# Patient Record
Sex: Female | Born: 1985
Health system: Southern US, Community
[De-identification: ages and names within clinical notes are randomized; demographics above are authoritative.]

## PROBLEM LIST (undated history)

## (undated) DIAGNOSIS — D649 Anemia, unspecified: Secondary | ICD-10-CM

## (undated) HISTORY — DX: Anemia, unspecified: D64.9

## (undated) HISTORY — PX: NO PAST SURGERIES: SHX2092

---

## 2004-07-16 ENCOUNTER — Emergency Department: Payer: Self-pay | Admitting: Emergency Medicine

## 2004-08-30 ENCOUNTER — Emergency Department: Payer: Self-pay | Admitting: Unknown Physician Specialty

## 2008-10-21 ENCOUNTER — Emergency Department: Payer: Self-pay | Admitting: Emergency Medicine

## 2011-02-21 ENCOUNTER — Emergency Department: Payer: Self-pay | Admitting: Unknown Physician Specialty

## 2012-07-11 ENCOUNTER — Emergency Department: Payer: Self-pay | Admitting: Emergency Medicine

## 2012-07-11 LAB — CBC
HCT: 34.3 % — ABNORMAL LOW (ref 35.0–47.0)
HGB: 11.2 g/dL — ABNORMAL LOW (ref 12.0–16.0)
MCH: 30.6 pg (ref 26.0–34.0)
MCV: 94 fL (ref 80–100)

## 2012-07-11 LAB — COMPREHENSIVE METABOLIC PANEL
Albumin: 4 g/dL (ref 3.4–5.0)
Alkaline Phosphatase: 62 U/L (ref 50–136)
BUN: 16 mg/dL (ref 7–18)
Chloride: 104 mmol/L (ref 98–107)
Co2: 25 mmol/L (ref 21–32)
EGFR (Non-African Amer.): >60
Glucose: 83 mg/dL (ref 65–99)
Osmolality: 272 (ref 275–301)
SGOT(AST): 17 U/L (ref 15–37)
SGPT (ALT): 24 U/L (ref 12–78)

## 2012-07-11 LAB — URINALYSIS, COMPLETE
Bacteria: NONE SEEN
Glucose,UR: NEGATIVE mg/dL (ref 0–75)
Ketone: NEGATIVE
Leukocyte Esterase: NEGATIVE
Nitrite: NEGATIVE
Ph: 5 (ref 4.5–8.0)
Protein: NEGATIVE
RBC,UR: 69 /HPF (ref 0–5)
Squamous Epithelial: 1

## 2012-07-11 LAB — HCG, QUANTITATIVE, PREGNANCY: Beta Hcg, Quant.: 1660 m[IU]/mL — ABNORMAL HIGH

## 2012-07-13 ENCOUNTER — Other Ambulatory Visit: Payer: Self-pay | Admitting: Obstetrics and Gynecology

## 2012-07-13 LAB — HCG, QUANTITATIVE, PREGNANCY: Beta Hcg, Quant.: 3463 m[IU]/mL — ABNORMAL HIGH

## 2012-07-19 ENCOUNTER — Emergency Department: Payer: Self-pay | Admitting: Emergency Medicine

## 2012-07-19 LAB — URINALYSIS, COMPLETE
Bilirubin,UR: NEGATIVE
Glucose,UR: NEGATIVE mg/dL (ref 0–75)
Ketone: NEGATIVE
Nitrite: NEGATIVE
Ph: 7 (ref 4.5–8.0)
Protein: 30
RBC,UR: 125 /HPF (ref 0–5)
Specific Gravity: 1.015 (ref 1.003–1.030)
Squamous Epithelial: 4
WBC UR: 2 /HPF (ref 0–5)

## 2012-07-19 LAB — CBC
HCT: 32.7 % — ABNORMAL LOW (ref 35.0–47.0)
HGB: 10.8 g/dL — ABNORMAL LOW (ref 12.0–16.0)
MCH: 30.6 pg (ref 26.0–34.0)
RBC: 3.54 10*6/uL — ABNORMAL LOW (ref 3.80–5.20)
RDW: 12.9 % (ref 11.5–14.5)
WBC: 11.9 10*3/uL — ABNORMAL HIGH (ref 3.6–11.0)

## 2012-07-19 LAB — COMPREHENSIVE METABOLIC PANEL
Alkaline Phosphatase: 52 U/L (ref 50–136)
BUN: 14 mg/dL (ref 7–18)
Calcium, Total: 8.7 mg/dL (ref 8.5–10.1)
Chloride: 106 mmol/L (ref 98–107)
EGFR (African American): >60
EGFR (Non-African Amer.): >60
Osmolality: 277 (ref 275–301)
SGOT(AST): 14 U/L — ABNORMAL LOW (ref 15–37)
SGPT (ALT): 20 U/L (ref 12–78)
Total Protein: 6.6 g/dL (ref 6.4–8.2)

## 2012-07-19 LAB — HCG, QUANTITATIVE, PREGNANCY: Beta Hcg, Quant.: 15663 m[IU]/mL — ABNORMAL HIGH

## 2012-07-19 LAB — LIPASE, BLOOD: Lipase: 73 U/L (ref 73–393)

## 2012-07-21 LAB — URINE CULTURE

## 2013-03-13 ENCOUNTER — Inpatient Hospital Stay: Payer: Self-pay | Admitting: Obstetrics & Gynecology

## 2013-03-13 LAB — CBC WITH DIFFERENTIAL/PLATELET
Basophil %: 0.1 %
Eosinophil %: 0 %
HCT: 34 % — ABNORMAL LOW (ref 35.0–47.0)
HGB: 11.3 g/dL — ABNORMAL LOW (ref 12.0–16.0)
Lymphocyte %: 11.2 %
MCHC: 33.1 g/dL (ref 32.0–36.0)
MCV: 89 fL (ref 80–100)
Monocyte #: 0.8 x10 3/mm (ref 0.2–0.9)
Monocyte %: 5.4 %
RBC: 3.82 10*6/uL (ref 3.80–5.20)
RDW: 13.5 % (ref 11.5–14.5)
WBC: 14.3 10*3/uL — ABNORMAL HIGH (ref 3.6–11.0)

## 2014-01-24 IMAGING — US US OB < 14 WEEKS - US OB TV
1 series · 14 of 28 positions shown · non-contrast
Comparison: none

REASON FOR EXAM: pain, cramping
COMMENTS:

PROCEDURE:     US  - US OB LESS THAN 14 WEEKS/W TRANS  - July 19, 2012  [DATE]
RESULT:     Comparison: None
TECHNIQUE: Multiple transabdominal gray-scale images and endovaginal
gray-scale images of the pelvis performed.

[Series 1: us ob < 14 weeks - us ob tv · 0.18mm/px · 14 of 110 slices shown]
[im 5/110]
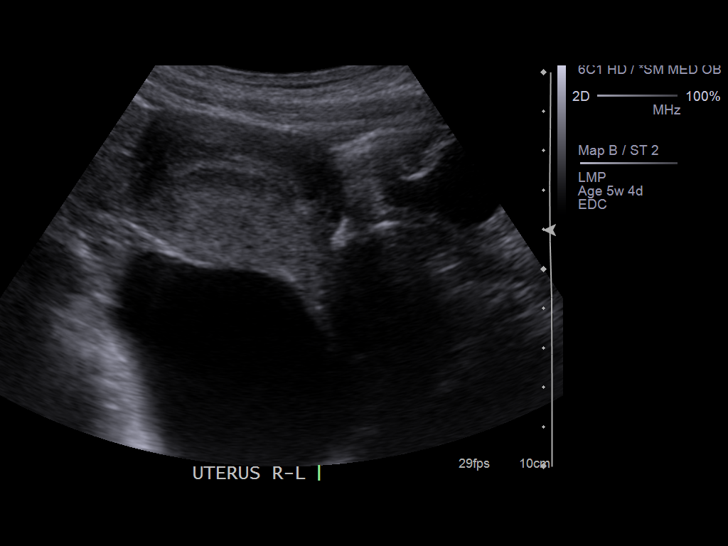
[im 13/110]
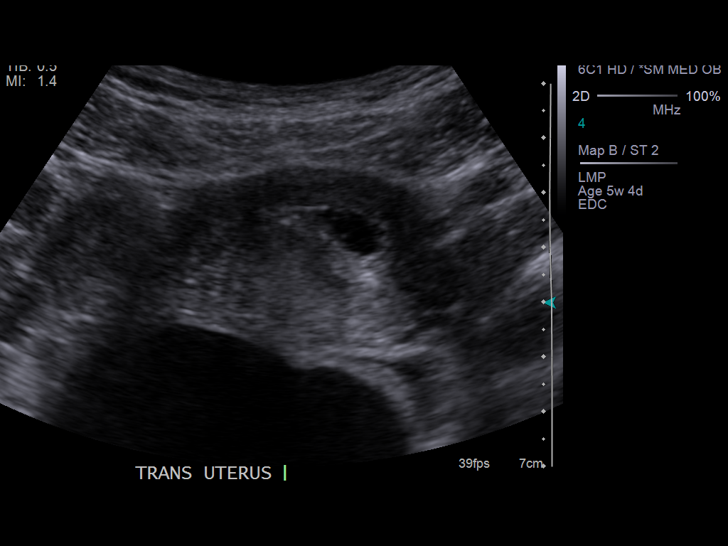
[im 21/110]
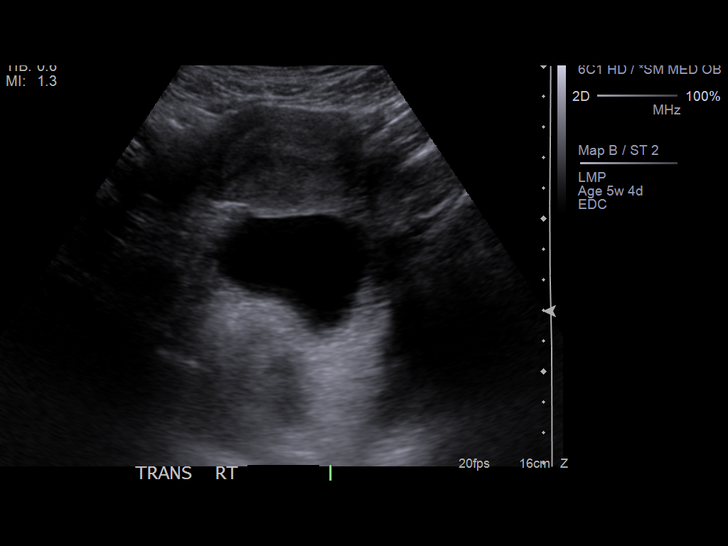
[im 29/110]
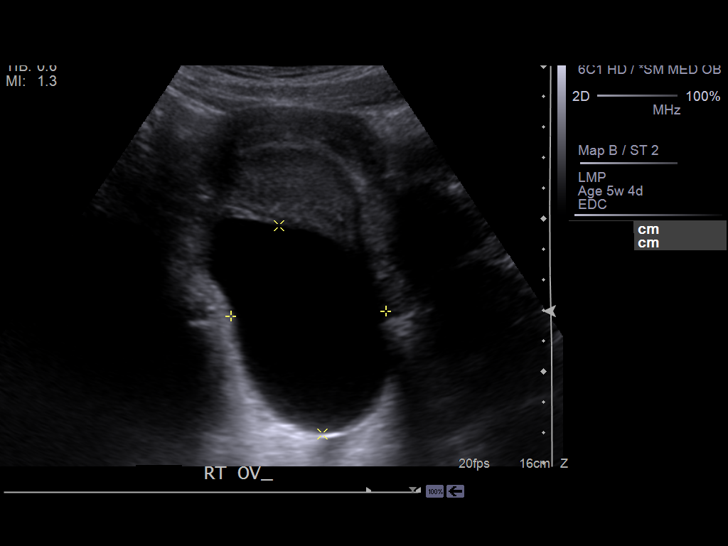
[im 37/110]
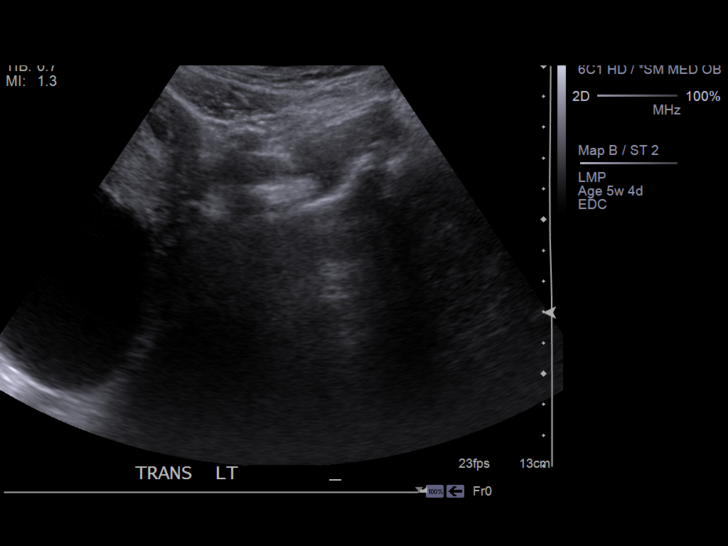
[im 45/110]
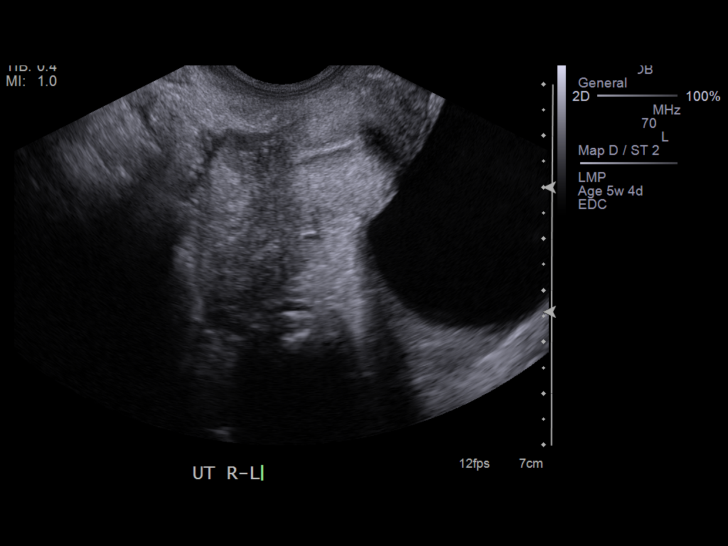
[im 53/110]
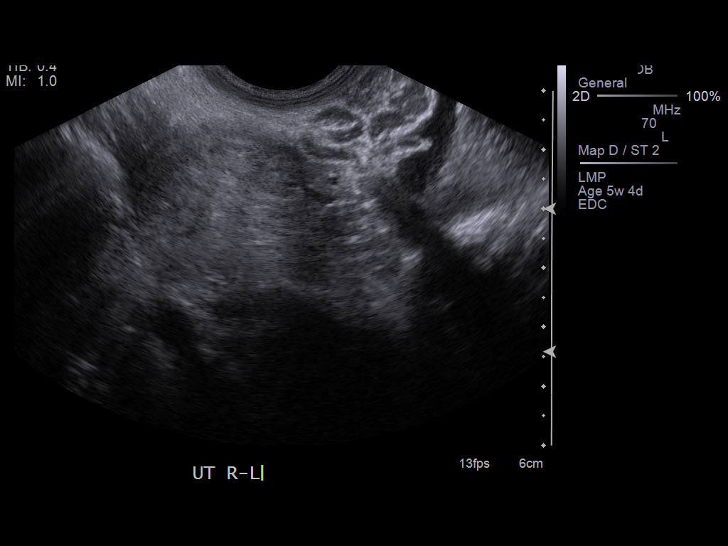
[im 61/110]
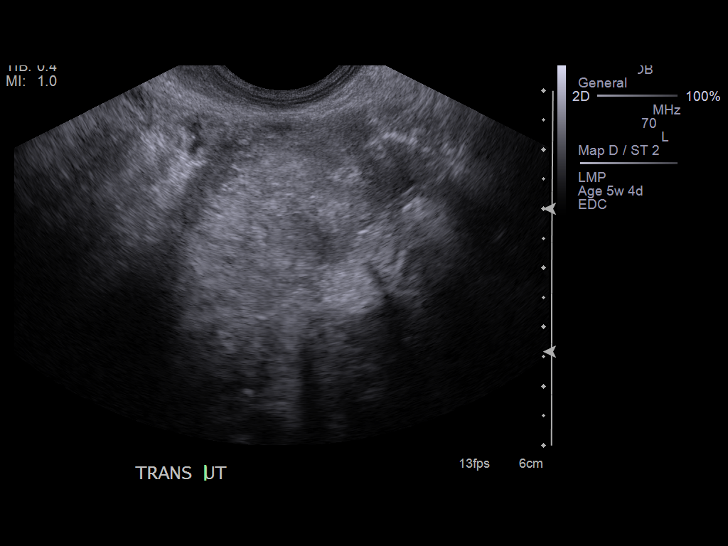
[im 69/110]
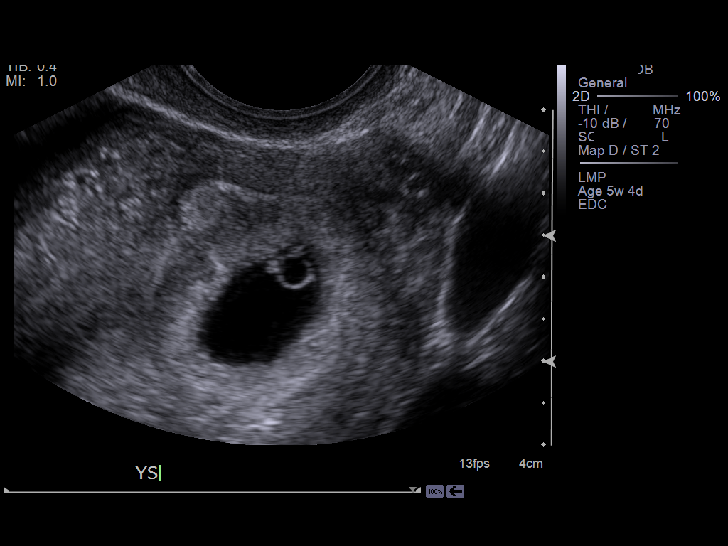
[im 77/110]
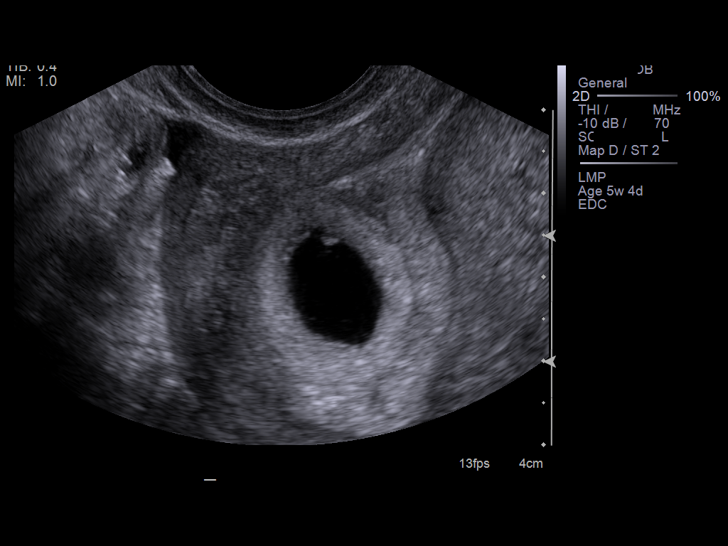
[im 85/110]
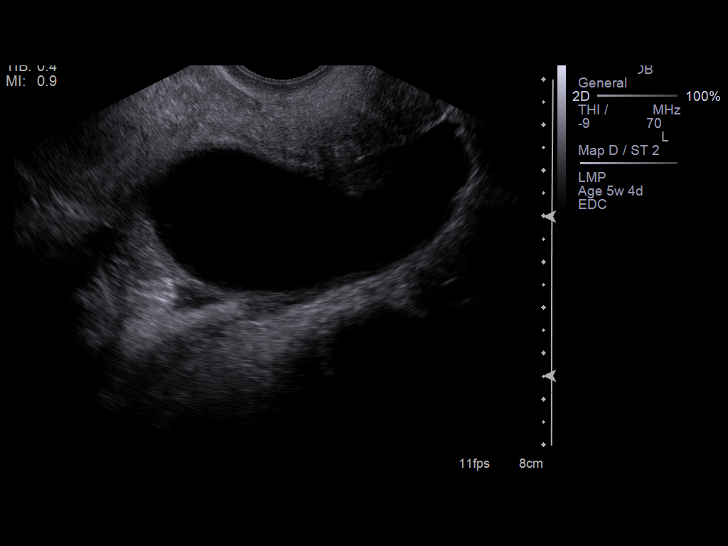
[im 93/110]
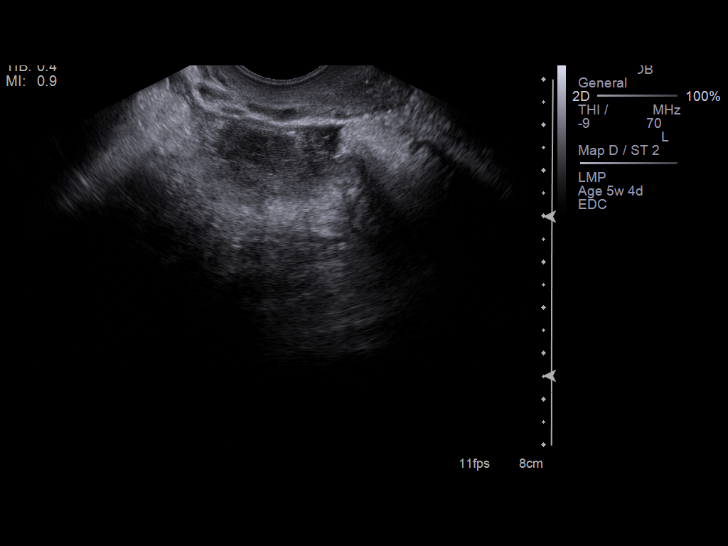
[im 101/110]
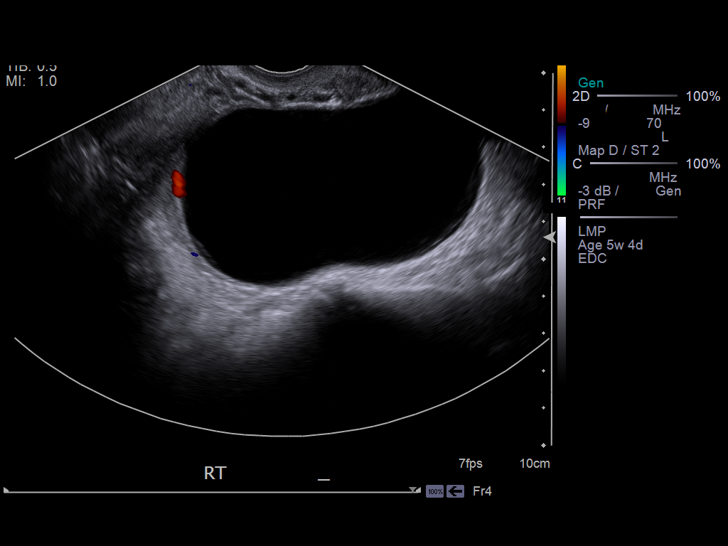
[im 110/110]
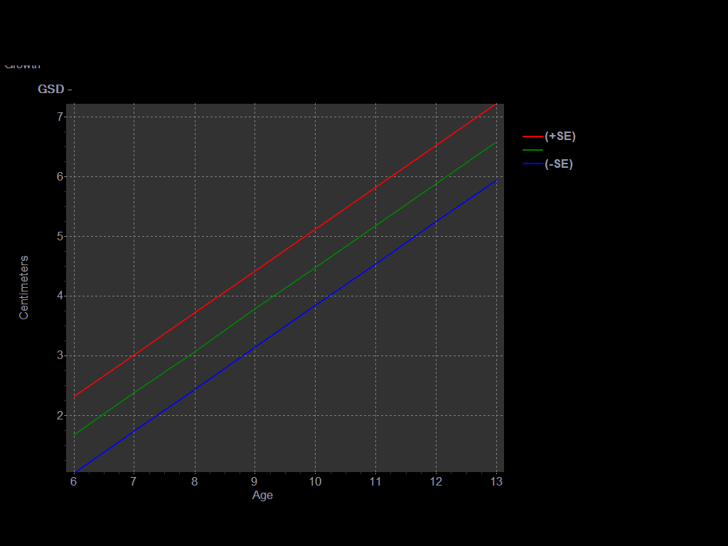

[14 of 28 positions shown; findings below may reference images not displayed]

FINDINGS: There is a single live intrauterine pregnancy visualized with a crown-rump
length of 0.5 cm dating the pregnancy at 6 weeks 3 days. There is a normal
yolk sac. There is a normal fetal heart rate of 94 beats per minute.

The right ovary measures 4.8 x 3.3 x 4.6 cm.  The left ovary is not
visualized. There is a right ovarian cyst measuring 8.2 x 4.3 x 7.2 cm.

There is no pelvic free fluid.
IMPRESSION: Single live intrauterine pregnancy dating 6 weeks 3 days with an estimated
due date of 03/11/2013.

There is a large right ovarian cystic mass measuring a 8.2 x 4.3 x 7.2 cm
likely representing a simple cyst, but given its size a cystic neoplasm is
not entirely excluded. Continued attention on followup examinations is
recommended.

[REDACTED]

## 2014-08-02 NOTE — H&P (Signed)
L&D Evaluation:  History Expanded:  HPI 29 yo G1, EDD of 03/17/13 per LMP & 12 wk US, presents with c/o regular ctx since 0300 this am. Denies LOF, VB or decreased FM. [facility] for early entry to care, elevated 1 hr with normal 3 hr, anemia. Needs TDAP.   Blood Type (Maternal) B positive   Group B Strep Results Maternal (Result >5wks must be treated as unknown) negative   Maternal HIV Negative   Maternal Syphilis Ab Nonreactive   Maternal Varicella Immune   Rubella Results (Maternal) immune   Maternal T-Dap Nonimmune   Patient's Medical History No Chronic Illness   Patient's Surgical History none   Medications Pre Natal Vitamins   Allergies NKDA   Social History d/c tobacco and marijuana in early pregnancy   ROS:  ROS see HPI   Exam:  Vital Signs stable   General no apparent distress   Mental Status clear   Chest clear   Heart normal sinus rhythm   Abdomen gravid, tender with contractions   Fetal Position vertex   Pelvic no external lesions, 4/90/-1   Mebranes Intact   FHT Category 1 tracing with occasional early or variable decelerations   Ucx regular   Impression:  Impression active labor   Plan:  Comments Admit for labor IV medication if desired Undecided at this time about epidural   Electronic Signatures: Vella KohlerBrothers, Anastasio Wogan K (CNM)  (Signed 20-Dec-14 14:14)  Authored: L&D Evaluation   Last Updated: 20-Dec-14 14:14 by Vella KohlerBrothers, Lakiah Dhingra K (CNM)

## 2015-09-12 ENCOUNTER — Encounter: Payer: Self-pay | Admitting: Emergency Medicine

## 2015-09-12 ENCOUNTER — Emergency Department
Admission: EM | Admit: 2015-09-12 | Discharge: 2015-09-12 | Disposition: A | Discharge status: Home / Self Care | Payer: Self-pay | Attending: Emergency Medicine | Admitting: Emergency Medicine

## 2015-09-12 DIAGNOSIS — M545 Low back pain, unspecified: Secondary | ICD-10-CM

## 2015-09-12 DIAGNOSIS — F17200 Nicotine dependence, unspecified, uncomplicated: Secondary | ICD-10-CM | POA: Insufficient documentation

## 2015-09-12 DIAGNOSIS — N390 Urinary tract infection, site not specified: Secondary | ICD-10-CM | POA: Insufficient documentation

## 2015-09-12 LAB — URINALYSIS COMPLETE WITH MICROSCOPIC (ARMC ONLY)
Bilirubin Urine: NEGATIVE
Glucose, UA: NEGATIVE mg/dL
KETONES UR: NEGATIVE mg/dL
Nitrite: NEGATIVE
PH: 7 (ref 5.0–8.0)
Protein, ur: 30 mg/dL — AB
Specific Gravity, Urine: 1.023 (ref 1.005–1.030)

## 2015-09-12 LAB — POCT PREGNANCY, URINE: Preg Test, Ur: NEGATIVE

## 2015-09-12 MED ORDER — HYDROCODONE-ACETAMINOPHEN 5-325 MG PO TABS: 1.0000 | ORAL_TABLET | ORAL | Status: DC | PRN

## 2015-09-12 MED ORDER — SULFAMETHOXAZOLE-TRIMETHOPRIM 800-160 MG PO TABS: 1.0000 | ORAL_TABLET | Freq: Two times a day (BID) | ORAL | Status: DC

## 2015-09-12 NOTE — Discharge Instructions (Signed)
[facility] if any continued problems. Increase fluids. Begin taking antibiotics as instructed until completely finished. Norco as needed for pain. Be aware that this may cause drowsiness and increase your risk for falling.

## 2015-09-12 NOTE — ED Provider Notes (Signed)
[facility] Emergency Department Provider Note   ____________________________________________  Time seen: Approximately 1:16 PM  I have reviewed the triage vital signs and the nursing notes.   HISTORY  Chief Complaint Back Pain    HPI Chelsea Adkins is a 30 y.o. female who arrives to the emergency department with complaint of new onset Right mid-back pain x <1 day. Pain was noticeable upon awakening this morning when the patient noticed that she could barely move. She attempted to pop her back but feels that may have made her pain worse. This morning it was also exacerbated by picking up her daughter and putting her into the car, leaving her barely able to walk at work this morning. Describes the pain as non-radiating, aching, "feeling like a knot", and rated at a 7/10. No pharmacologic or non-pharmacologic management of pain. Denies previous trauma to the area or similar pain events in the past. She believes it is possible that she slept in a weird way, but is unable to recall. She denies fevers, chills, fatigue, urinary symptoms, hx of nephrolithiasis, weakness, numbness and paraesthesias.     History reviewed. No pertinent past medical history.  There are no active problems to display for this patient.   History reviewed. No pertinent past surgical history.  Current Outpatient Rx  Name  Route  Sig  Dispense  Refill  . HYDROcodone-acetaminophen (NORCO/VICODIN) 5-325 MG tablet   Oral   Take 1 tablet by mouth every 4 (four) hours as needed for moderate pain.   20 tablet   0   . sulfamethoxazole-trimethoprim (BACTRIM DS,SEPTRA DS) 800-160 MG tablet   Oral   Take 1 tablet by mouth 2 (two) times daily.   20 tablet   0     Allergies Review of patient's allergies indicates no known allergies.  No family history on file.  Social History Social History  Substance Use Topics  . Smoking status: Current Every Day Smoker  . Smokeless tobacco: None    . Alcohol Use: Yes    Review of Systems Constitutional: No fever/chills Eyes: No visual changes. ENT: No sore throat. Cardiovascular: Denies chest pain. Respiratory: Denies shortness of breath. Gastrointestinal: No abdominal pain.  No nausea, no vomiting.  No diarrhea.  No constipation. Genitourinary: Negative for dysuria.Negative for vaginal discharge. Musculoskeletal: Positive for right sided back pain Skin: Negative for rash. Neurological: Negative for headaches, focal weakness or numbness.  10-point ROS otherwise negative.  ____________________________________________   PHYSICAL EXAM:  VITAL SIGNS: ED Triage Vitals  Enc Vitals Group     BP 09/12/15 1130 99/73 mmHg     Pulse Rate 09/12/15 1130 79     Resp 09/12/15 1130 20     Temp 09/12/15 1130 98.1 F (36.7 C)     Temp Source 09/12/15 1130 Oral     SpO2 09/12/15 1130 98 %     Weight 09/12/15 1130 200 lb (90.719 kg)     Height 09/12/15 1130  (1.702 m)     Head Cir --      Peak Flow --      Pain Score 09/12/15 1131 7     Pain Loc --      Pain Edu? --      Excl. in GC? --     Constitutional: Alert and oriented. Well appearing and in no acute distress. Eyes: Conjunctivae are normal. PERRL. EOMI. Head: Atraumatic. Nose: No congestion/rhinnorhea. Mouth/Throat: Mucous membranes are moist.  Oropharynx non-erythematous. Neck: No stridor.  Hematological/Lymphatic/Immunilogical: No cervical lymphadenopathy. Cardiovascular: Normal rate, regular rhythm. Grossly normal heart sounds.  Good peripheral circulation. Respiratory: Normal respiratory effort.  No retractions. Lungs CTAB. Gastrointestinal: Soft and nontender. No distention.  Slightly positive for right CVA tenderness. Musculoskeletal: Moves upper and lower extremities without any difficulty. Range of motion of the lumbar spine is restricted secondary to discomfort. There is no active muscle spasm seen. Patient is tender right flank area. Straight leg raises  increased back pain. Neurologic:  Normal speech and language. No gross focal neurologic deficits are appreciated. No gait instability. Skin:  Skin is warm, dry and intact. No rash noted. Psychiatric: Mood and affect are normal. Speech and behavior are normal.  ____________________________________________   LABS (all labs ordered are listed, but only abnormal results are displayed)  Labs Reviewed  URINALYSIS COMPLETEWITH MICROSCOPIC ([facility] ONLY) - Abnormal; Notable for the following:    Color, Urine YELLOW (*)    APPearance CLOUDY (*)    Hgb urine dipstick 3+ (*)    Protein, ur 30 (*)    Leukocytes, UA 1+ (*)    Bacteria, UA RARE (*)    Squamous Epithelial / LPF 6-30 (*)    All other components within normal limits  POC URINE PREG, ED  POCT PREGNANCY, URINE    PROCEDURES  Procedure(s) performed: None  Critical Care performed: No  ____________________________________________   INITIAL IMPRESSION / ASSESSMENT AND PLAN / ED COURSE  Pertinent labs & imaging results that were available during my care of the patient were reviewed by me and considered in my medical decision making (see chart for details).  Patient was started on Bactrim DS twice a day for 10 days along with Norco as needed for pain. Patient is encouraged to drink plenty of fluids and to follow-up with her primary care doctor or can of the clinic if any continued problems. ____________________________________________   FINAL CLINICAL IMPRESSION(S) / ED DIAGNOSES  Final diagnoses:  Acute lumbar back pain  Acute urinary tract infection      NEW MEDICATIONS STARTED DURING THIS VISIT:  Discharge Medication List as of 09/12/2015  2:51 PM    START taking these medications   Details  HYDROcodone-acetaminophen (NORCO/VICODIN) 5-325 MG tablet Take 1 tablet by mouth every 4 (four) hours as needed for moderate pain., Starting 09/12/2015, Until Discontinued, Print    sulfamethoxazole-trimethoprim (BACTRIM  DS,SEPTRA DS) 800-160 MG tablet Take 1 tablet by mouth 2 (two) times daily., Starting 09/12/2015, Until Discontinued, Print         Note:  This document was prepared using Dragon voice recognition software and may include unintentional dictation errors.    Tommi RumpsRhonda L Summers, PA-C 09/12/15 1532  Leona CarryLinda M Leontine Radman, MD 09/12/15 [phone]

## 2015-09-12 NOTE — ED Notes (Signed)
Pt to ed with c/o back pain that started this am when she awoke.  Denies injury.

## 2015-09-12 NOTE — ED Notes (Signed)
States she woke up with pain to mid back this am .. denies any injury prior to this am  Ambulates slowly d/t pain.  Area to mid back is tender on palpation

## 2017-04-23 ENCOUNTER — Encounter: Payer: Self-pay | Admitting: Emergency Medicine

## 2017-04-23 ENCOUNTER — Other Ambulatory Visit: Payer: Self-pay

## 2017-04-23 ENCOUNTER — Emergency Department
Admission: EM | Admit: 2017-04-23 | Discharge: 2017-04-23 | Disposition: A | Discharge status: Home / Self Care | Payer: Self-pay | Attending: Emergency Medicine | Admitting: Emergency Medicine

## 2017-04-23 DIAGNOSIS — F17200 Nicotine dependence, unspecified, uncomplicated: Secondary | ICD-10-CM | POA: Insufficient documentation

## 2017-04-23 DIAGNOSIS — J3489 Other specified disorders of nose and nasal sinuses: Secondary | ICD-10-CM | POA: Insufficient documentation

## 2017-04-23 DIAGNOSIS — B9789 Other viral agents as the cause of diseases classified elsewhere: Secondary | ICD-10-CM | POA: Insufficient documentation

## 2017-04-23 DIAGNOSIS — J069 Acute upper respiratory infection, unspecified: Secondary | ICD-10-CM | POA: Insufficient documentation

## 2017-04-23 DIAGNOSIS — R0981 Nasal congestion: Secondary | ICD-10-CM | POA: Insufficient documentation

## 2017-04-23 MED ORDER — PSEUDOEPH-BROMPHEN-DM 30-2-10 MG/5ML PO SYRP: 5.0000 mL | ORAL_SOLUTION | Freq: Four times a day (QID) | ORAL | 0 refills | Status: DC | PRN

## 2017-04-23 NOTE — Discharge Instructions (Signed)
Follow-up with your primary care doctor or [facility] if any continued problems.  To increase fluids.  Tylenol or ibuprofen as needed for body aches or fever.  Begin taking Bromfed-DM as needed for cough and congestion.

## 2017-04-23 NOTE — ED Notes (Signed)
Pt presents with cough that started on Monday Pt has taken OTC mucinex sinus relief with no relief. Pt states she is congested in head and chest. Pt denies fever n/v/d. Pt is NAD awaiting EDP.

## 2017-04-23 NOTE — ED Provider Notes (Signed)
[facility] Emergency Department Provider Note   ____________________________________________   First MD Initiated Contact with Patient 04/23/17 1226     (approximate)  I have reviewed the triage vital signs and the nursing notes.   HISTORY  Chief Complaint Cough  HPI Chelsea Adkins is a 32 y.o. female is here with complaint of cough for 3 days.  Patient states that she has had a low-grade temp at home.  Patient has taken over-the-counter cough medication without any relief.  She states she has had a low-grade temp at home.  She denies any nausea, vomiting, or diarrhea.  Patient states that she did miss work.   History reviewed. No pertinent past medical history.  There are no active problems to display for this patient.   History reviewed. No pertinent surgical history.  Prior to Admission medications   Medication Sig Start Date End Date Taking? Authorizing Provider  brompheniramine-pseudoephedrine-DM 30-2-10 MG/5ML syrup Take 5 mLs by mouth 4 (four) times daily as needed. 04/23/17   Tommi RumpsSummers, Rhonda L, PA-C    Allergies Patient has no known allergies.  No family history on file.  Social History Social History   Tobacco Use  . Smoking status: Current Every Day Smoker  Substance Use Topics  . Alcohol use: Yes  . Drug use: No    Review of Systems Constitutional: Positive low-grade fever/positive chills Eyes: No visual changes. ENT: No sore throat. Cardiovascular: Denies chest pain. Respiratory: Denies shortness of breath.  Positive nonproductive cough. Gastrointestinal: No abdominal pain.  No nausea, no vomiting.  No diarrhea.   Musculoskeletal: Negative for muscle aches. Skin: Negative for rash. Neurological: Negative for headaches, focal weakness or numbness. ___________________________________________   PHYSICAL EXAM:  VITAL SIGNS: ED Triage Vitals [04/23/17 1209]  Enc Vitals Group     BP 116/69     Pulse Rate 83     Resp 16      Temp 98.7 F (37.1 C)     Temp Source Oral     SpO2 99 %     Weight 173 lb (78.5 kg)     Height 5\' 7"  (1.702 m)     Head Circumference      Peak Flow      Pain Score      Pain Loc      Pain Edu?      Excl. in GC?    Constitutional: Alert and oriented. Well appearing and in no acute distress. Eyes: Conjunctivae are normal.  Head: Atraumatic. Nose: Mild congestion/rhinnorhea.  TMs are dull bilaterally. Mouth/Throat: Mucous membranes are moist.  Oropharynx non-erythematous.  Moderate posterior drainage seen. Neck: No stridor.   Hematological/Lymphatic/Immunilogical: No cervical lymphadenopathy. Cardiovascular: Normal rate, regular rhythm. Grossly normal heart sounds.  Good peripheral circulation. Respiratory: Normal respiratory effort.  No retractions. Lungs CTAB. Musculoskeletal: Moves upper and lower extremities and normal gait was noted. Neurologic:  Normal speech and language. No gross focal neurologic deficits are appreciated. No gait instability. Skin:  Skin is warm, dry and intact. No rash noted. Psychiatric: Mood and affect are normal. Speech and behavior are normal.  ____________________________________________   LABS (all labs ordered are listed, but only abnormal results are displayed)  Labs Reviewed - No data to display  PROCEDURES  Procedure(s) performed: None  Procedures  Critical Care performed: No  ____________________________________________   INITIAL IMPRESSION / ASSESSMENT AND PLAN / ED COURSE Patient was given prescription for Bromfed-DM as needed for cough and congestion.  She is to increase fluids.  Tylenol or ibuprofen as needed for body aches and fever.  She is to follow-up with her PCP or [facility] if any continued problems.  She is aware that this is a viral process and most likely will need to run its course.  In the meantime she will treat symptoms. ____________________________________________   FINAL CLINICAL IMPRESSION(S) / ED  DIAGNOSES  Final diagnoses:  Viral URI with cough     ED Discharge Orders        Ordered    brompheniramine-pseudoephedrine-DM 30-2-10 MG/5ML syrup  4 times daily PRN     04/23/17 1255       Note:  This document was prepared using Dragon voice recognition software and may include unintentional dictation errors.    Tommi Rumps, PA-C 04/23/17 1414    Emily Filbert, MD 04/23/17 [phone]

## 2017-04-23 NOTE — ED Triage Notes (Signed)
Pt to ED via POV c/o cough x 3 days. Pt states that she is unable to get anything up when she coughs. Pt also c/o congestion and chills. Pt denies fever. Pt in NAD

## 2017-04-23 NOTE — ED Notes (Signed)
First Nurse Note:  Patient alert and oriented, complaining of cough.

## 2018-11-25 ENCOUNTER — Ambulatory Visit: Payer: 59 | Admitting: Urology

## 2018-11-25 ENCOUNTER — Encounter: Payer: Self-pay | Admitting: Urology

## 2018-11-25 ENCOUNTER — Other Ambulatory Visit: Payer: Self-pay

## 2018-11-25 VITALS — BP 99/63 | HR 84 | Ht 67.0 in | Wt 180.0 lb

## 2018-11-25 DIAGNOSIS — R3129 Other microscopic hematuria: Secondary | ICD-10-CM | POA: Diagnosis not present

## 2018-11-25 LAB — MICROSCOPIC EXAMINATION
Bacteria, UA: NONE SEEN
RBC: >30 /hpf — AB (ref 0–2)

## 2018-11-25 LAB — URINALYSIS, COMPLETE
Bilirubin, UA: NEGATIVE
Glucose, UA: NEGATIVE
Ketones, UA: NEGATIVE
Leukocytes,UA: NEGATIVE
Nitrite, UA: NEGATIVE
Specific Gravity, UA: 1.02 (ref 1.005–1.030)
Urobilinogen, Ur: 1 mg/dL (ref 0.2–1.0)
pH, UA: 7 (ref 5.0–7.5)

## 2018-11-25 NOTE — Patient Instructions (Signed)
Cystoscopy Cystoscopy is a procedure that is used to help diagnose and sometimes treat conditions that affect the lower urinary tract. The lower urinary tract includes the bladder and the urethra. The urethra is the tube that drains urine from the bladder. Cystoscopy is done using a thin, tube-shaped instrument with a light and camera at the end (cystoscope). The cystoscope may be hard or flexible, depending on the goal of the procedure. The cystoscope is inserted through the urethra, into the bladder. Cystoscopy may be recommended if you have:  Urinary tract infections that keep coming back.  Blood in the urine (hematuria).  An inability to control when you urinate (urinary incontinence) or an overactive bladder.  Unusual cells found in a urine sample.  A blockage in the urethra, such as a urinary stone.  Painful urination.  An abnormality in the bladder found during an intravenous pyelogram (IVP) or CT scan. Cystoscopy may also be done to remove a sample of tissue to be examined under a microscope (biopsy). Tell a health care provider about:  Any allergies you have.  All medicines you are taking, including vitamins, herbs, eye drops, creams, and over-the-counter medicines.  Any problems you or family members have had with anesthetic medicines.  Any blood disorders you have.  Any surgeries you have had.  Any medical conditions you have.  Whether you are pregnant or may be pregnant. What are the risks? Generally, this is a safe procedure. However, problems may occur, including:  Infection.  Bleeding.  Allergic reactions to medicines.  Damage to other structures or organs. What happens before the procedure?  Ask your health care provider about: ? Changing or stopping your regular medicines. This is especially important if you are taking diabetes medicines or blood thinners. ? Taking medicines such as aspirin and ibuprofen. These medicines can thin your blood. Do not take  these medicines unless your health care provider tells you to take them. ? Taking over-the-counter medicines, vitamins, herbs, and supplements.  Follow instructions from your health care provider about eating or drinking restrictions.  Ask your health care provider what steps will be taken to help prevent infection. These may include: ? Washing skin with a germ-killing soap. ? Taking antibiotic medicine.  You may have an exam or testing, such as: ? X-rays of the bladder, urethra, or kidneys. ? Urine tests to check for signs of infection.  Plan to have someone take you home from the hospital or clinic. What happens during the procedure?   You will be given one or more of the following: ? A medicine to help you relax (sedative). ? A medicine to numb the area (local anesthetic).  The area around the opening of your urethra will be cleaned.  The cystoscope will be passed through your urethra into your bladder.  Germ-free (sterile) fluid will flow through the cystoscope to fill your bladder. The fluid will stretch your bladder so that your health care provider can clearly examine your bladder walls.  Your doctor will look at the urethra and bladder. Your doctor may take a biopsy or remove stones.  The cystoscope will be removed, and your bladder will be emptied. The procedure may vary among health care providers and hospitals. What can I expect after the procedure? After the procedure, it is common to have:  Some soreness or pain in your abdomen and urethra.  Urinary symptoms. These include: ? Mild pain or burning when you urinate. Pain should stop within a few minutes after you urinate. This   may last for up to 1 week. ? A small amount of blood in your urine for several days. ? Feeling like you need to urinate but producing only a small amount of urine. Follow these instructions at home: Medicines  Take over-the-counter and prescription medicines only as told by your health care  provider.  If you were prescribed an antibiotic medicine, take it as told by your health care provider. Do not stop taking the antibiotic even if you start to feel better. General instructions  Return to your normal activities as told by your health care provider. Ask your health care provider what activities are safe for you.  Do not drive for 24 hours if you were given a sedative during your procedure.  Watch for any blood in your urine. If the amount of blood in your urine increases, call your health care provider.  Follow instructions from your health care provider about eating or drinking restrictions.  If a tissue sample was removed for testing (biopsy) during your procedure, it is up to you to get your test results. Ask your health care provider, or the department that is doing the test, when your results will be ready.  Drink enough fluid to keep your urine pale yellow.  Keep all follow-up visits as told by your health care provider. This is important. Contact a health care provider if you:  Have pain that gets worse or does not get better with medicine, especially pain when you urinate.  Have trouble urinating.  Have more blood in your urine. Get help right away if you:  Have blood clots in your urine.  Have abdominal pain.  Have a fever or chills.  Are unable to urinate. Summary  Cystoscopy is a procedure that is used to help diagnose and sometimes treat conditions that affect the lower urinary tract.  Cystoscopy is done using a thin, tube-shaped instrument with a light and camera at the end.  After the procedure, it is common to have some soreness or pain in your abdomen and urethra.  Watch for any blood in your urine. If the amount of blood in your urine increases, call your health care provider.  If you were prescribed an antibiotic medicine, take it as told by your health care provider. Do not stop taking the antibiotic even if you start to feel better. This  information is not intended to replace advice given to you by your health care provider. Make sure you discuss any questions you have with your health care provider. Document Released: 03/08/2000 Document Revised: 03/03/2018 Document Reviewed: 03/03/2018 [employer] Patient Education  2020 [employer]  

## 2018-11-25 NOTE — Progress Notes (Signed)
11/25/2018 12:31 PM   Evorn Gongierra L Iams 1986/03/04 [id number]  Referring provider: Danella PentonMiller, Mark F, MD [id number] [facility] [address] [facility] [place],  [place] [id number]  Chief Complaint  Patient presents with  . Hematuria    New patient    HPI: 33 year old female referred for further evaluation of microscopic hematuria.  She was seen and evaluated as a new patient in mid July.  As part of this evaluation, she underwent routine labs at which time her urinalysis appeared frankly positive for UTI.  This included many bacteria, blood, and leukocytes.  No culture was sent.  She returned the following week to drop off another urine specimen.  At this point, there were no further leukocytes in her urine and few bacteria.  She had persistent greater than 50 red blood cells but overall her urine appeared to be improving.  She is not on her menstrual cycle when providing the specimens.  Notably, in review of her previous medical records, she is had blood in her urine dating back to at least 2014 consistently and never urinalysis.  She is never seen blood in her urine.  No dysuria.  No urinary symptoms including no urgency, frequency, dysuria.  No history of UTIs.  No history of flank pain or kidney stones.  She has been losing weight but intentionally.  No family history of GU malignancy.  She does note however that her sister mother and grandmother all have microscopic blood in her urine and have undergone evaluations.  She does note today that she is undergoing a pelvic ultrasound for evaluation for possible uterine fibroids.  PMH: Past Medical History:  Diagnosis Date  . Anemia     Surgical History: Past Surgical History:  Procedure Laterality Date  . NO PAST SURGERIES      Home Medications:  Allergies as of 11/25/2018   No Known Allergies     Medication List       Accurate as of November 25, 2018 12:31 PM. If you have any questions, ask your nurse or  doctor.        STOP taking these medications   brompheniramine-pseudoephedrine-DM 30-2-10 MG/5ML syrup Stopped by: Vanna ScotlandAshley Lirio Bach, MD     TAKE these medications   ferrous sulfate 325 (65 FE) MG tablet Take 325 mg by mouth daily with breakfast.       Allergies: No Known Allergies  Family History: No family history on file.  Social History:  reports that she has been smoking. She has never used smokeless tobacco. She reports current alcohol use. She reports that she does not use drugs.  ROS: UROLOGY Frequent Urination?: No Hard to postpone urination?: No Burning/pain with urination?: No Get up at night to urinate?: No Leakage of urine?: No Urine stream starts and stops?: No Trouble starting stream?: No Do you have to strain to urinate?: No Blood in urine?: Yes Urinary tract infection?: No Sexually transmitted disease?: No Injury to kidneys or bladder?: No Painful intercourse?: No Weak stream?: No Currently pregnant?: No Vaginal bleeding?: No Last menstrual period?: 11/18/2018  Gastrointestinal Nausea?: No Vomiting?: No Indigestion/heartburn?: No Diarrhea?: No Constipation?: No  Constitutional Fever: No Night sweats?: No Weight loss?: No Fatigue?: No  Skin Skin rash/lesions?: No Itching?: No  Eyes Blurred vision?: No Double vision?: No  Ears/Nose/Throat Sore throat?: No Sinus problems?: No  Hematologic/Lymphatic Swollen glands?: No Easy bruising?: No  Cardiovascular Leg swelling?: No Chest pain?: No  Respiratory Cough?: No Shortness of breath?: No  Endocrine Excessive thirst?:  No  Musculoskeletal Back pain?: No Joint pain?: No  Neurological Headaches?: No Dizziness?: No  Psychologic Depression?: No Anxiety?: No  Physical Exam: BP 99/63   Pulse 84   Ht 5\' 7"  (1.702 m)   Wt 180 lb (81.6 kg)   LMP 11/18/2018   BMI 28.19 kg/m   Constitutional:  Alert and oriented, No acute distress. HEENT: [place] AT, moist mucus membranes.   Trachea midline, no masses. Cardiovascular: No clubbing, cyanosis, or edema. Respiratory: Normal respiratory effort, no increased work of breathing. Skin: No rashes, bruises or suspicious lesions. Neurologic: Grossly intact, no focal deficits, moving all 4 extremities. Psychiatric: Normal mood and affect.  Laboratory Data: Creatinine 0.6 on 10/09/2018 Hemoglobin 32.7 Multiple previous urinalysis reviewed  Urinalysis Urinalysis including microscopic was personally reviewed today.  See epic for details.  Completely unremarkable other than for the presence of greater than 30 red blood cells per high-powered field on microscopic exam.  Pertinent Imaging: N/A  Assessment & Plan:    1. Microscopic hematuria Personal history of external microscopic hematuria greater than 30 red blood cells per high-power field  Based on the new AUA guidelines for evaluation of microscopic hematuria, she thousand to the intermediate risk category given the degree of her hematuria.  I recommended renal ultrasound as well as cystoscopy for further evaluation of her GU system.  We discussed the differential diagnosis for microscopic blood in the urine.  She is agreeable this plan was to proceed.  All questions answered. - Urinalysis, Complete   Return in about 4 weeks (around 12/23/2018) for RUS, cysto.  Hollice Espy, MD  [facility] [address], [place],  [address] [phone]

## 2018-12-21 ENCOUNTER — Other Ambulatory Visit: Payer: Self-pay

## 2018-12-21 ENCOUNTER — Ambulatory Visit
Admission: RE | Admit: 2018-12-21 | Discharge: 2018-12-21 | Disposition: A | Discharge status: Home / Self Care | Payer: 59 | Source: Ambulatory Visit | Attending: Urology | Admitting: Urology

## 2018-12-21 DIAGNOSIS — R3129 Other microscopic hematuria: Secondary | ICD-10-CM | POA: Diagnosis present

## 2018-12-23 ENCOUNTER — Encounter: Payer: Self-pay | Admitting: Urology

## 2018-12-23 ENCOUNTER — Other Ambulatory Visit: Payer: Self-pay

## 2018-12-23 ENCOUNTER — Ambulatory Visit (INDEPENDENT_AMBULATORY_CARE_PROVIDER_SITE_OTHER): Payer: 59 | Admitting: Urology

## 2018-12-23 VITALS — BP 104/61 | HR 71 | Ht 67.0 in | Wt 182.0 lb

## 2018-12-23 DIAGNOSIS — R3129 Other microscopic hematuria: Secondary | ICD-10-CM | POA: Diagnosis not present

## 2018-12-23 LAB — MICROSCOPIC EXAMINATION
Bacteria, UA: NONE SEEN
RBC: >30 /hpf — AB (ref 0–2)

## 2018-12-23 LAB — URINALYSIS, COMPLETE
Bilirubin, UA: NEGATIVE
Glucose, UA: NEGATIVE
Ketones, UA: NEGATIVE
Leukocytes,UA: NEGATIVE
Nitrite, UA: NEGATIVE
Protein,UA: NEGATIVE
Specific Gravity, UA: 1.01 (ref 1.005–1.030)
Urobilinogen, Ur: 0.2 mg/dL (ref 0.2–1.0)
pH, UA: 6.5 (ref 5.0–7.5)

## 2018-12-23 NOTE — Progress Notes (Signed)
   12/23/18  CC:  Chief Complaint  Patient presents with  . Cysto    HPI: 33 year old female with painless microscopic hematuria who presents for cystoscopic scopic evaluation.  She underwent renal ultrasound on 12/13/2018 which was unremarkable.  Normal bilateral kidneys without masses tumors lesions or stones.  Vitals:   12/23/18 1010  BP: 104/61  Pulse: 71   NED. A&Ox3.   No respiratory distress   Abd soft, NT, ND Normal external genitalia with patent urethral meatus  Cystoscopy Procedure Note  Patient identification was confirmed, informed consent was obtained, and patient was prepped using Betadine solution.  Lidocaine jelly was administered per urethral meatus.    Procedure: - Flexible cystoscope introduced, without any difficulty.   - Thorough search of the bladder revealed:    normal urethral meatus    normal urothelium    no stones    no ulcers     no tumors    no urethral polyps    no trabeculation  - Ureteral orifices were normal in position and appearance.  Post-Procedure: - Patient tolerated the procedure well  Assessment/ Plan:  1. Microscopic hematuria Status post negative evaluation including negative renal ultrasound and cystoscopy today which is reassuring  Given the absence of protein and normal renal function, will hold off on nephrology referral at this point in time.  Plan for follow-up in 1 year for reevaluation given the degree of her hematuria, greater than 30 red blood cells today.  We will recheck urine and possibly cytology in a year.  She is agreeable this plan. - Urinalysis, Complete      Return in about 1 year (around 12/23/2019) for UA with PA.  Hollice Espy, MD

## 2019-02-25 ENCOUNTER — Encounter: Payer: Self-pay | Admitting: Emergency Medicine

## 2019-02-25 ENCOUNTER — Emergency Department: Payer: 59

## 2019-02-25 ENCOUNTER — Emergency Department
Admission: EM | Admit: 2019-02-25 | Discharge: 2019-02-25 | Disposition: A | Discharge status: Home / Self Care | Payer: 59 | Attending: Emergency Medicine | Admitting: Emergency Medicine

## 2019-02-25 ENCOUNTER — Other Ambulatory Visit: Payer: Self-pay

## 2019-02-25 DIAGNOSIS — M545 Low back pain, unspecified: Secondary | ICD-10-CM

## 2019-02-25 DIAGNOSIS — F17210 Nicotine dependence, cigarettes, uncomplicated: Secondary | ICD-10-CM | POA: Insufficient documentation

## 2019-02-25 DIAGNOSIS — R42 Dizziness and giddiness: Secondary | ICD-10-CM | POA: Diagnosis not present

## 2019-02-25 DIAGNOSIS — R519 Headache, unspecified: Secondary | ICD-10-CM | POA: Diagnosis not present

## 2019-02-25 DIAGNOSIS — Z793 Long term (current) use of hormonal contraceptives: Secondary | ICD-10-CM | POA: Diagnosis not present

## 2019-02-25 LAB — POCT PREGNANCY, URINE: Preg Test, Ur: NEGATIVE

## 2019-02-25 MED ORDER — IBUPROFEN 600 MG PO TABS: 600.0000 mg | ORAL_TABLET | Freq: Four times a day (QID) | ORAL | 0 refills | Status: DC | PRN

## 2019-02-25 MED ORDER — OXYCODONE-ACETAMINOPHEN 5-325 MG PO TABS
1.0000 | ORAL_TABLET | Freq: Once | ORAL | Status: AC
  Administered 2019-02-25: 20:00:00 1 via ORAL
  Filled 2019-02-25: qty 1

## 2019-02-25 MED ORDER — CYCLOBENZAPRINE HCL 5 MG PO TABS: ORAL_TABLET | ORAL | 0 refills | Status: DC
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
## 2019-02-25 NOTE — ED Notes (Signed)
Pt to ct 
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
## 2019-04-29 ENCOUNTER — Ambulatory Visit (LOCAL_COMMUNITY_HEALTH_CENTER): Payer: 59 | Admitting: Physician Assistant

## 2019-04-29 ENCOUNTER — Encounter: Payer: Self-pay | Admitting: Physician Assistant

## 2019-04-29 ENCOUNTER — Other Ambulatory Visit: Payer: Self-pay

## 2019-04-29 VITALS — BP 116/69 | Ht 67.0 in | Wt 192.0 lb

## 2019-04-29 DIAGNOSIS — Z720 Tobacco use: Secondary | ICD-10-CM

## 2019-04-29 DIAGNOSIS — Z309 Encounter for contraceptive management, unspecified: Secondary | ICD-10-CM

## 2019-04-29 MED ORDER — MEDROXYPROGESTERONE ACETATE 150 MG/ML IM SUSP
150.0000 mg | Freq: Once | INTRAMUSCULAR | Status: AC
  Administered 2019-04-29: 150 mg via INTRAMUSCULAR

## 2019-04-29 NOTE — Progress Notes (Signed)
  Family Planning Visit- Repeat Yearly Visit  Subjective:  Chelsea Adkins is a 34 y.o. being seen today for an well woman visit and to discuss family planning options.    She is currently using OCP (estrogen/progesterone) for pregnancy prevention. Patient reports she does not want a pregnancy in the next year. Patient  does not have a problem list on file. Re ports last Pap WNL within last 1-2 years.  No chief complaint on file.   Patient reports she had gyn eval yest for irreg vag bleeding and plan was to discontinue OCPs and start DMPA, which was Rx for pt to pick up. She did not get this filled and wants to get DMPA here instead. Last sex 04/09/19, still bleeding. 04/28/19 note was reviewed.  Patient denies any other concerns today, declines STI testing besides HIV/syphilis bloodwork.   Does the patient desire a pregnancy in the next year? (OKQ flowsheet)  See flowsheet for other program required questions.   Body mass index is 30.07 kg/m. - Patient is eligible for diabetes screening based on BMI and age >11?  not applicable HA1C ordered? no  Patient reports 1 of partners in last year. Desires STI screening?  Yes  Does the patient have a current or past history of drug use? No   No components found for: HCV]   Health Maintenance Due  Topic Date Due  . HIV Screening  03/10/2000  . TETANUS/TDAP  03/10/2004  . PAP SMEAR-Modifier  03/10/2006  . INFLUENZA VACCINE  10/24/2018    ROS  The following portions of the patient's history were reviewed and updated as appropriate: allergies, current medications, past family history, past medical history, past social history, past surgical history and problem list. Problem list updated.  Objective:   Vitals:   04/29/19 1313  BP: 116/69  Weight: 192 lb (87.1 kg)  Height: 5\' 7"  (1.702 m)    Physical Exam    Assessment and Plan:  Chelsea Adkins is a 34 y.o. female presenting to the [facility] Department for an initial  well woman exam/family planning visit  Contraception counseling: Reviewed all forms of birth control options in the tiered based approach. available including abstinence; over the counter/barrier methods; hormonal contraceptive medication including pill, patch, ring, injection,contraceptive implant; hormonal and nonhormonal IUDs; permanent sterilization options including vasectomy and the various tubal sterilization modalities. Risks, benefits, and typical effectiveness rates were reviewed.  Questions were answered.  Written information was also given to the patient to review.   Patient was not offered ECP.   Patient desires DMPA, this was prescribed for patient. She will follow up in  11-13 weeks for surveillance.  Patient was counseled on the side effect of the method chosen, the correct use of her desired method and how to discontinue in the future. She was told to call with any further questions, or with any concerns about this method of contraception.  Emphasized use of condoms 100% of the time for STI prevention.  1. Encounter for contraceptive management, unspecified type DMPA 150 mg IM today, repeat every 11-13 weeks for 1 year. - HIV [place] [facility] - Syphilis Serology, [facility]  2. Tobacco abuse [place] Quitline info given. Pt considering quitting, but not ready to plan a quit date.     No follow-ups on file.  Future Appointments  Date Time Provider Department Center  12/23/2019  3:00 PM [place], 12/25/2019 A, PA-C [facility] None    Carollee Herter, PA-C

## 2019-04-29 NOTE — Progress Notes (Signed)
Patient started OC in November to help control heavy periods and migraines during menses. She states she skipped period in December and then started menses 04/11/2019 and has not stopped. Was seen at [facility] yesterday and they prescribed Depo for patient to pick up at pharmacy and schedule appointments for administration. Patient prefers to have Depo given here. Burt Knack, RN

## 2019-04-29 NOTE — Progress Notes (Signed)
Depo given per provider orders, given left deltoid, tolerated well. Next Depo card given.Burt Knack, RN

## 2019-06-20 ENCOUNTER — Ambulatory Visit: Payer: 59 | Attending: Internal Medicine

## 2019-06-20 DIAGNOSIS — Z23 Encounter for immunization: Secondary | ICD-10-CM

## 2019-06-20 NOTE — Progress Notes (Signed)
   Covid-19 Vaccination Clinic  Name:  Chelsea Adkins    MRN: [id number] DOB: 1985/08/06  06/20/2019  [name] was observed post Covid-19 immunization for 15 minutes without incident. She was provided with Vaccine Information Sheet and instruction to access the V-Safe system.   [name] was instructed to call 911 with any severe reactions post vaccine: Marland Kitchen Difficulty breathing  . Swelling of face and throat  . A fast heartbeat  . A bad rash all over body  . Dizziness and weakness   Immunizations Administered    Name Date Dose VIS Date Route   Pfizer COVID-19 Vaccine 06/20/2019  4:07 PM 0.3 mL 03/05/2019 Intramuscular   Manufacturer: [employer], [employer]   Lot: [id number]   NDC: [address]-4

## 2019-07-11 ENCOUNTER — Ambulatory Visit: Payer: 59 | Attending: Internal Medicine

## 2019-07-11 DIAGNOSIS — Z23 Encounter for immunization: Secondary | ICD-10-CM

## 2019-07-11 NOTE — Progress Notes (Signed)
   Covid-19 Vaccination Clinic  Name:  DOROTHE ELMORE    MRN: [id number] DOB: 1985-09-13  07/11/2019  Ms. Lykins was observed post Covid-19 immunization for 15 minutes without incident. She was provided with Vaccine Information Sheet and instruction to access the V-Safe system.   Ms. Rottman was instructed to call 911 with any severe reactions post vaccine: Marland Kitchen Difficulty breathing  . Swelling of face and throat  . A fast heartbeat  . A bad rash all over body  . Dizziness and weakness   Immunizations Administered    Name Date Dose VIS Date Route   Pfizer COVID-19 Vaccine 07/11/2019  4:18 PM 0.3 mL 03/05/2019 Intramuscular   Manufacturer: [employer], [employer]   Lot: [id number]   NDC: [address]-4

## 2019-07-27 ENCOUNTER — Ambulatory Visit: Payer: 59

## 2019-07-28 ENCOUNTER — Other Ambulatory Visit: Payer: Self-pay

## 2019-07-28 ENCOUNTER — Ambulatory Visit (LOCAL_COMMUNITY_HEALTH_CENTER): Payer: 59

## 2019-07-28 VITALS — BP 102/72 | Ht 67.0 in | Wt 189.0 lb

## 2019-07-28 DIAGNOSIS — Z30013 Encounter for initial prescription of injectable contraceptive: Secondary | ICD-10-CM

## 2019-07-28 DIAGNOSIS — Z3009 Encounter for other general counseling and advice on contraception: Secondary | ICD-10-CM

## 2019-07-28 DIAGNOSIS — Z3042 Encounter for surveillance of injectable contraceptive: Secondary | ICD-10-CM

## 2019-07-28 MED ORDER — MEDROXYPROGESTERONE ACETATE 150 MG/ML IM SUSP
150.0000 mg | Freq: Once | INTRAMUSCULAR | Status: AC
Start: 2019-07-28 — End: 2019-07-28
  Administered 2019-07-28: 12:00:00 150 mg via INTRAMUSCULAR

## 2019-07-28 NOTE — Progress Notes (Signed)
Pt is 12.6 weeks post depo today. DMPA 150 mg IM administered today per Kristine Garbe, PA order dated 04/29/19.

## 2019-10-14 ENCOUNTER — Ambulatory Visit (LOCAL_COMMUNITY_HEALTH_CENTER): Payer: 59

## 2019-10-14 ENCOUNTER — Other Ambulatory Visit: Payer: Self-pay

## 2019-10-14 VITALS — BP 103/68 | Ht 67.0 in | Wt 191.5 lb

## 2019-10-14 DIAGNOSIS — Z3042 Encounter for surveillance of injectable contraceptive: Secondary | ICD-10-CM

## 2019-10-14 DIAGNOSIS — Z3009 Encounter for other general counseling and advice on contraception: Secondary | ICD-10-CM

## 2019-10-14 DIAGNOSIS — Z30013 Encounter for initial prescription of injectable contraceptive: Secondary | ICD-10-CM

## 2019-10-14 MED ORDER — MEDROXYPROGESTERONE ACETATE 150 MG/ML IM SUSP
150.0000 mg | Freq: Once | INTRAMUSCULAR | Status: AC
  Administered 2019-10-14: 150 mg via INTRAMUSCULAR

## 2019-10-14 NOTE — Progress Notes (Signed)
Pt 11 weeks 1 day post depo. DMPA 150mg  given IM today per order by A. Streilein, PA-C dated 04/29/19. Pt tolerated well. Next depo due 12/30/19. Declined multivitamins today. 02/29/20, RN

## 2019-12-23 ENCOUNTER — Ambulatory Visit: Payer: 59 | Admitting: Urology

## 2019-12-23 ENCOUNTER — Encounter: Payer: Self-pay | Admitting: Urology

## 2020-01-05 ENCOUNTER — Ambulatory Visit (LOCAL_COMMUNITY_HEALTH_CENTER): Payer: Medicaid Other

## 2020-01-05 ENCOUNTER — Other Ambulatory Visit: Payer: Self-pay

## 2020-01-05 VITALS — BP 121/79 | Ht 67.0 in | Wt 194.5 lb

## 2020-01-05 DIAGNOSIS — Z3042 Encounter for surveillance of injectable contraceptive: Secondary | ICD-10-CM

## 2020-01-05 DIAGNOSIS — Z3009 Encounter for other general counseling and advice on contraception: Secondary | ICD-10-CM | POA: Diagnosis not present

## 2020-01-05 MED ORDER — MEDROXYPROGESTERONE ACETATE 150 MG/ML IM SUSP
150.0000 mg | Freq: Once | INTRAMUSCULAR | Status: AC
  Administered 2020-01-05: 150 mg via INTRAMUSCULAR

## 2020-01-05 NOTE — Progress Notes (Signed)
Pt is 11.6 weeks post depo today.  DMPA 150 mg IM administered today per Landry Dyke, PA order dated 04/29/19.

## 2020-02-24 ENCOUNTER — Telehealth: Payer: Self-pay | Admitting: Urology

## 2020-02-24 NOTE — Telephone Encounter (Signed)
Please call Chelsea Adkins and have her reschedule her missed appointment in September for hematuria.  She had a lot of microscopic blood in her urine, so it is very important that she have her recheck.  We need to see if the micro heme has resolved and if not we need to get cytologies to rule out malignancies.

## 2020-03-10 NOTE — Telephone Encounter (Signed)
LMOM to return call to reschedule appointment. 

## 2020-03-23 ENCOUNTER — Other Ambulatory Visit: Payer: Self-pay

## 2020-03-23 ENCOUNTER — Ambulatory Visit (LOCAL_COMMUNITY_HEALTH_CENTER): Payer: Medicaid Other

## 2020-03-23 VITALS — BP 110/74 | Ht 67.0 in | Wt 188.5 lb

## 2020-03-23 DIAGNOSIS — Z3042 Encounter for surveillance of injectable contraceptive: Secondary | ICD-10-CM

## 2020-03-23 DIAGNOSIS — Z3009 Encounter for other general counseling and advice on contraception: Secondary | ICD-10-CM | POA: Diagnosis not present

## 2020-03-23 DIAGNOSIS — Z30013 Encounter for initial prescription of injectable contraceptive: Secondary | ICD-10-CM

## 2020-03-23 MED ORDER — MEDROXYPROGESTERONE ACETATE 150 MG/ML IM SUSP
150.0000 mg | Freq: Once | INTRAMUSCULAR | Status: AC
  Administered 2020-03-23: 150 mg via INTRAMUSCULAR

## 2020-03-23 NOTE — Progress Notes (Signed)
11 weeks 1 day post depo. Voices no problems. DMPA 150mg  IM given Left Deltoid today per SO by Dr. . Newton. Pt due for physical 04/2020.  Tolerated well. Due for next depo 06/08/2020, pt aware. 06/10/2020, RN

## 2020-06-16 ENCOUNTER — Encounter: Payer: Self-pay | Admitting: Advanced Practice Midwife

## 2020-06-16 ENCOUNTER — Other Ambulatory Visit: Payer: Self-pay

## 2020-06-16 ENCOUNTER — Ambulatory Visit (LOCAL_COMMUNITY_HEALTH_CENTER): Payer: Medicaid Other | Admitting: Advanced Practice Midwife

## 2020-06-16 VITALS — BP 101/69 | HR 86 | Resp 16 | Ht 67.0 in | Wt 195.4 lb

## 2020-06-16 DIAGNOSIS — Z3009 Encounter for other general counseling and advice on contraception: Secondary | ICD-10-CM

## 2020-06-16 DIAGNOSIS — E669 Obesity, unspecified: Secondary | ICD-10-CM

## 2020-06-16 DIAGNOSIS — Z3042 Encounter for surveillance of injectable contraceptive: Secondary | ICD-10-CM

## 2020-06-16 LAB — WET PREP FOR TRICH, YEAST, CLUE
Trichomonas Exam: NEGATIVE
Yeast Exam: NEGATIVE

## 2020-06-16 LAB — HEMOGLOBIN, FINGERSTICK: Hemoglobin: 11.7 g/dL (ref 11.1–15.9)

## 2020-06-16 MED ORDER — MEDROXYPROGESTERONE ACETATE 150 MG/ML IM SUSP
150.0000 mg | INTRAMUSCULAR | Status: AC
  Administered 2020-06-16 – 2021-03-09 (×4): 150 mg via INTRAMUSCULAR

## 2020-06-16 NOTE — Progress Notes (Signed)
[facility] [address] Main Number: [phone]    Family Planning Visit- Initial Visit  Subjective:  Chelsea Adkins is a 35 y.o.SBF  G1P1001 (71 yo daughter) exsmoker  being seen today for an initial annual visit and to discuss contraceptive options.  The patient is currently using Depo Provera for pregnancy prevention. Patient reports she does not want a pregnancy in the next year.  Patient has the following medical conditions has Obesity BMI=30.6 on their problem list.  Chief Complaint  Patient presents with  . Contraception    Patient reports wants physical and DMPA today.  Last DMPA 03/23/20 (12 1/7). LMP 2021. Last sex 05/06/20 with condom; with current partner x 1 year; 1 partner in last 3 mo.  Last cig 06/2019. Last MJ 2021. Last ETOH yesterday (2 beers) qoday. Last physical ?. Last pap 06/23/14 neg.  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  Living with daughter  Patient denies vaping, cigars   Body mass index is 30.6 kg/m. - Patient is eligible for diabetes screening based on BMI and age >28?  not applicable HA1C ordered? not applicable  Patient reports 1  partner/s in last year. Desires STI screening?  Yes  Has patient been screened once for HCV in the past?  No  No results found for: HCVAB  Does the patient have current drug use (including MJ), have a partner with drug use, and/or has been incarcerated since last result? No  If yes-- Screen for HCV through [facility]   Does the patient meet criteria for HBV testing? No  Criteria:  -Household, sexual or needle sharing contact with HBV -History of drug use -HIV positive -Those with known Hep C   Health Maintenance Due  Topic Date Due  . Hepatitis C Screening  Never done  . HIV Screening  Never done  . TETANUS/TDAP  Never done  . PAP SMEAR-Modifier  Never done  . INFLUENZA VACCINE  10/24/2019  . COVID-19 Vaccine (3 - Booster for Pfizer  series) 01/10/2020    Review of Systems  All other systems reviewed and are negative.   The following portions of the patient's history were reviewed and updated as appropriate: allergies, current medications, past family history, past medical history, past social history, past surgical history and problem list. Problem list updated.   See flowsheet for other program required questions.  Objective:   Vitals:   06/16/20 1520  BP: 101/69  Pulse: 86  Resp: 16  Weight: 195 lb 6.4 oz (88.6 kg)  Height: 5\' 7"  (1.702 m)    Physical Exam Constitutional:      Appearance: Normal appearance. She is obese.  HENT:     Head: Normocephalic and atraumatic.     Mouth/Throat:     Mouth: Mucous membranes are moist.     Comments: Last dental exam >1 year ago--encouraged exam No visible caries seen Eyes:     Conjunctiva/sclera: Conjunctivae normal.  Neck:     Comments: Thyroid without masses or enlargement Cardiovascular:     Rate and Rhythm: Normal rate and regular rhythm.  Pulmonary:     Effort: Pulmonary effort is normal.     Breath sounds: Normal breath sounds.  Chest:  Breasts:     Right: Normal.     Left: Normal.    Abdominal:     Palpations: Abdomen is soft.     Comments: Fair tone, soft without masses or tenderness  Genitourinary:  General: Normal vulva.     Exam position: Lithotomy position.     Vagina: Vaginal discharge (grey creamy malodorous leukorrhea, ph>4.5) present.     Cervix: Friability (friable to pap) present.     Uterus: Normal.      Adnexa: Right adnexa normal and left adnexa normal.     Rectum: Normal.  Musculoskeletal:        General: Normal range of motion.     Cervical back: Normal range of motion and neck supple.  Skin:    General: Skin is warm and dry.  Neurological:     Mental Status: She is alert.  Psychiatric:        Mood and Affect: Mood normal.       Assessment and Plan:  Chelsea Adkins is a 35 y.o. female presenting to the  [facility] Department for an initial annual wellness/contraceptive visit  Contraception counseling: Reviewed all forms of birth control options in the tiered based approach. available including abstinence; over the counter/barrier methods; hormonal contraceptive medication including pill, patch, ring, injection,contraceptive implant, ECP; hormonal and nonhormonal IUDs; permanent sterilization options including vasectomy and the various tubal sterilization modalities. Risks, benefits, and typical effectiveness rates were reviewed.  Questions were answered.  Written information was also given to the patient to review.  Patient desires DMPA, this was prescribed for patient. She will follow up in  11-13 wks for surveillance.  She was told to call with any further questions, or with any concerns about this method of contraception.  Emphasized use of condoms 100% of the time for STI prevention.  Patient was offered ECP. ECP was not accepted by the patient. ECP counseling was not given - see RN documentation  1. Family planning  Immunization nurse consult Treat wet mount per standing orders - Hemoglobin, venipuncture - WET PREP FOR TRICH, YEAST, CLUE - Chlamydia/Gonorrhea [place] [facility] - HIV [place] [facility] - Syphilis Serology, [place] [facility] - IGP, Aptima HPV  2. Encounter for surveillance of injectable contraceptive DMPA 150 mg IM q 11-13 wks x 1 year  3. Obesity, unspecified classification, unspecified obesity type, unspecified whether serious comorbidity present      No follow-ups on file.  No future appointments.  Alberteen Spindle, CNM

## 2020-06-21 LAB — HM HIV SCREENING LAB: HM HIV Screening: NEGATIVE

## 2020-06-22 ENCOUNTER — Encounter: Payer: Self-pay | Admitting: Urology

## 2020-06-22 LAB — IGP, APTIMA HPV
HPV Aptima: NEGATIVE
PAP Smear Comment: 0
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
## 2020-09-19 ENCOUNTER — Other Ambulatory Visit: Payer: Self-pay

## 2020-09-19 ENCOUNTER — Ambulatory Visit (LOCAL_COMMUNITY_HEALTH_CENTER): Payer: Medicaid Other

## 2020-09-19 VITALS — BP 104/72 | Ht 67.0 in | Wt 201.5 lb

## 2020-09-19 DIAGNOSIS — Z3042 Encounter for surveillance of injectable contraceptive: Secondary | ICD-10-CM | POA: Diagnosis not present

## 2020-09-19 DIAGNOSIS — Z3009 Encounter for other general counseling and advice on contraception: Secondary | ICD-10-CM | POA: Diagnosis not present

## 2020-09-19 NOTE — Progress Notes (Signed)
13 weeks 4 days post depo. Voices no complaints. Depo given today per order by Hazle Coca, CNM dated 06/16/2020. Tolerated well R delt. Next depo due 12/05/2020, pt aware. Jerel Shepherd, RN

## 2020-12-15 ENCOUNTER — Ambulatory Visit (LOCAL_COMMUNITY_HEALTH_CENTER): Payer: Medicaid Other

## 2020-12-15 ENCOUNTER — Other Ambulatory Visit: Payer: Self-pay

## 2020-12-15 VITALS — BP 101/65 | Ht 67.0 in | Wt 207.5 lb

## 2020-12-15 DIAGNOSIS — Z3009 Encounter for other general counseling and advice on contraception: Secondary | ICD-10-CM | POA: Diagnosis not present

## 2020-12-15 DIAGNOSIS — Z3042 Encounter for surveillance of injectable contraceptive: Secondary | ICD-10-CM

## 2020-12-15 NOTE — Progress Notes (Signed)
12 weeks 3 days post depo. Voices no concerns. Depo given per order by Hazle Coca, CNM dated 06/16/2020. Tolerated well L delt. Next depo due 03/02/2021, pt aware. Jerel Shepherd, RN

## 2021-03-09 ENCOUNTER — Other Ambulatory Visit: Payer: Self-pay

## 2021-03-09 ENCOUNTER — Ambulatory Visit (LOCAL_COMMUNITY_HEALTH_CENTER): Payer: Medicaid Other

## 2021-03-09 VITALS — BP 107/71 | Ht 67.0 in | Wt 207.0 lb

## 2021-03-09 DIAGNOSIS — Z3042 Encounter for surveillance of injectable contraceptive: Secondary | ICD-10-CM

## 2021-03-09 DIAGNOSIS — Z3009 Encounter for other general counseling and advice on contraception: Secondary | ICD-10-CM | POA: Diagnosis not present

## 2021-03-09 NOTE — Progress Notes (Signed)
12 weeks 0 days post depo. Voices no concerns. Depo given per order by Hazle Coca, CNM dated 06/16/2020. Tolerated well R delt. Next depo due 05/25/2021 and RP due 06/16/2021, pt aware of both and has reminder. Jerel Shepherd, RN

## 2021-06-19 ENCOUNTER — Other Ambulatory Visit: Payer: Self-pay

## 2021-06-19 ENCOUNTER — Encounter: Payer: Self-pay | Admitting: Advanced Practice Midwife

## 2021-06-19 ENCOUNTER — Ambulatory Visit (LOCAL_COMMUNITY_HEALTH_CENTER): Payer: Medicaid Other | Admitting: Advanced Practice Midwife

## 2021-06-19 VITALS — BP 98/61 | Ht 68.0 in | Wt 217.2 lb

## 2021-06-19 DIAGNOSIS — Z309 Encounter for contraceptive management, unspecified: Secondary | ICD-10-CM | POA: Diagnosis not present

## 2021-06-19 DIAGNOSIS — F17200 Nicotine dependence, unspecified, uncomplicated: Secondary | ICD-10-CM

## 2021-06-19 DIAGNOSIS — Z3009 Encounter for other general counseling and advice on contraception: Secondary | ICD-10-CM

## 2021-06-19 DIAGNOSIS — Z3042 Encounter for surveillance of injectable contraceptive: Secondary | ICD-10-CM | POA: Diagnosis not present

## 2021-06-19 LAB — HM HIV SCREENING LAB: HM HIV Screening: NEGATIVE

## 2021-06-19 MED ORDER — MEDROXYPROGESTERONE ACETATE 150 MG/ML IM SUSP
150.0000 mg | INTRAMUSCULAR | Status: AC
  Administered 2021-06-19 – 2022-03-04 (×4): 150 mg via INTRAMUSCULAR

## 2021-06-19 NOTE — Progress Notes (Signed)
Depo given, tolerated well, next Depo card given. .Anaka Beazer Brewer-Jensen, RN  

## 2021-06-19 NOTE — Progress Notes (Signed)
Patient here for yearly PE and Depo. Last PE 06/16/2020, last Pap test 06/22/2020, NIL. Last Depo 03/09/2021 (14 6/7 since last Depo). Jenetta Downer, RN  ?

## 2021-06-19 NOTE — Progress Notes (Signed)
[facility] ?Main Number: [phone] ? ? ? ?Family Planning Visit- Initial Visit ? ?Subjective:  ?Chelsea Adkins is a 36 y.o. SBF smoker  G1P1001 (84 yo daughter)  being seen today for an initial annual visit and to discuss reproductive life planning.  The patient is currently using Hormonal Injection for pregnancy prevention. Patient reports   does not want a pregnancy in the next year.   ? ? report they are looking for a method that provides Discrete method ? ?Patient has the following medical conditions has Obesity BMI=33.0 and Smoker on their problem list. ? ?Chief Complaint  ?Patient presents with  ? Annual Exam  ? Contraception  ? ? ?Patient reports here for physical and DMPA. Last physical 06/16/20. Last DMPA 03/09/21 (14 6/7).  Last sex 06/12/21 without condom; with current partner x 1 year; 1 partner in last 3 mo. No menses on DMPA. Smoking 3x/wk (10 cpweek), last MJ 03/2021. Last ETOH last night (1 mixed drink) q weekend. Happy with DMPA. Working 25 hrs/wk and living with 31 yo daughter. Last dental exam 2020. Last pap 06/22/20 neg HPV neg.  ? ?Patient denies vaping, cigars ? ?Body mass index is 33.03 kg/m?. - Patient is eligible for diabetes screening based on BMI and age 123XX123?  not applicable ?HA1C ordered? not applicable ? ?Patient reports 1  partner/s in last year. Desires STI screening?  Yes ? ?Has patient been screened once for HCV in the past?  No ? No results found for: HCVAB ? ?Does the patient have current drug use (including MJ), have a partner with drug use, and/or has been incarcerated since last result? Yes  ?If yes-- Screen for HCV through [place] ?  ?Does the patient meet criteria for HBV testing? No ? ?Criteria:  ?-Household, sexual or needle sharing contact with HBV ?-History of drug use ?-HIV positive ?-Those with known Hep C ? ? ?Health Maintenance Due  ?Topic Date Due  ? Hepatitis C Screening  Never done  ?  TETANUS/TDAP  10/24/2012  ? COVID-19 Vaccine (3 - Booster for Pfizer series) 09/05/2019  ? INFLUENZA VACCINE  10/23/2020  ? ? ?Review of Systems  ?All other systems reviewed and are negative. ? ?The following portions of the patient's history were reviewed and updated as appropriate: allergies, current medications, past family history, past medical history, past social history, past surgical history and problem list. Problem list updated. ? ? ?See flowsheet for other program required questions. ? ?Objective:  ? ?Vitals:  ? 06/19/21 1547  ?BP: 98/61  ?Weight: 217 lb 3.2 oz (98.5 kg)  ?Height: 5\' 8"  (1.727 m)  ? ? ?Physical Exam ?Constitutional:   ?   Appearance: Normal appearance. She is obese.  ?HENT:  ?   Head: Normocephalic and atraumatic.  ?   Mouth/Throat:  ?   Mouth: Mucous membranes are moist.  ?   Comments: Good dentition; last dental exam 2020 ?Eyes:  ?   Conjunctiva/sclera: Conjunctivae normal.  ?Neck:  ?   Thyroid: No thyroid mass, thyromegaly or thyroid tenderness.  ?Cardiovascular:  ?   Rate and Rhythm: Normal rate and regular rhythm.  ?Pulmonary:  ?   Effort: Pulmonary effort is normal.  ?   Breath sounds: Normal breath sounds.  ?Chest:  ?Breasts: ?   Right: Normal.  ?   Left: Normal.  ?Abdominal:  ?   Palpations: Abdomen is soft.  ?   Comments: Soft without masses  or tenderness, fair tone  ?Genitourinary: ?   General: Normal vulva.  ?   Exam position: Lithotomy position.  ?   Vagina: Vaginal discharge (moderate white creamy leukorrrhea, ph<4.5) present.  ?   Cervix: Normal.  ?   Uterus: Normal.   ?   Adnexa: Right adnexa normal and left adnexa normal.  ?   Rectum: Normal.  ?Musculoskeletal:     ?   General: Normal range of motion.  ?   Cervical back: Normal range of motion and neck supple.  ?Skin: ?   General: Skin is warm and dry.  ?Neurological:  ?   Mental Status: She is alert.  ?Psychiatric:     ?   Mood and Affect: Mood normal.  ? ? ? ? ?Assessment and Plan:  ?Chelsea Adkins is a 36 y.o. female  presenting to the [facility] Department for an initial annual wellness/contraceptive visit ? ?Contraception counseling: Reviewed options based on patient desire and reproductive life plan. Patient is interested in Hormonal Injection. This was provided to the patient today.  if not why not clearly documented ? ?Risks, benefits, and typical effectiveness rates were reviewed.  Questions were answered.  Written information was also given to the patient to review.   ? ?The patient will follow up in  11-13  weeks for surveillance.  The patient was told to call with any further questions, or with any concerns about this method of contraception.  Emphasized use of condoms 100% of the time for STI prevention. ? ?Need for ECP was assessed. Patient does not meet criteria.  Reviewed options and patient desired No method of ECP, declined all   ? ?1. Family planning ?Wet mount sent to [employer] because no lab staff today ?Immunization nurse consult ?- HIV [place] [facility] ?- Syphilis Serology, [place] [facility] ?- medroxyPROGESTERone (DEPO-PROVERA) injection 150 mg ?- Chlamydia/Gonorrhea [place] [facility] ?- WET PREP FOR [place], [place], CLUE ? ?2. Encounter for surveillance of injectable contraceptive ?May have DMPA 150 mg IM q 11-13 wks x 1 year ? ?3. Smoker ?Counseled via 5 A's to stop smoking ? ? ? ? ?No follow-ups on file. ? ?No future appointments. ? ?Herbie Saxon, CNM ? ?

## 2021-06-21 LAB — WET PREP FOR TRICH, YEAST, CLUE
Clue Cell Exam: POSITIVE — AB
Trichomonas Exam: NEGATIVE
Yeast Exam: NEGATIVE

## 2021-09-14 ENCOUNTER — Ambulatory Visit (LOCAL_COMMUNITY_HEALTH_CENTER): Payer: Medicaid Other

## 2021-09-14 VITALS — BP 119/79 | Ht 68.0 in | Wt 213.5 lb

## 2021-09-14 DIAGNOSIS — Z309 Encounter for contraceptive management, unspecified: Secondary | ICD-10-CM

## 2021-09-14 DIAGNOSIS — Z3042 Encounter for surveillance of injectable contraceptive: Secondary | ICD-10-CM | POA: Diagnosis not present

## 2021-09-14 DIAGNOSIS — Z3009 Encounter for other general counseling and advice on contraception: Secondary | ICD-10-CM

## 2021-12-03 ENCOUNTER — Ambulatory Visit: Payer: Medicaid Other

## 2021-12-03 ENCOUNTER — Ambulatory Visit (LOCAL_COMMUNITY_HEALTH_CENTER): Payer: Medicaid Other

## 2021-12-03 VITALS — BP 106/87 | Ht 68.0 in | Wt 212.0 lb

## 2021-12-03 DIAGNOSIS — Z3009 Encounter for other general counseling and advice on contraception: Secondary | ICD-10-CM

## 2021-12-03 DIAGNOSIS — Z309 Encounter for contraceptive management, unspecified: Secondary | ICD-10-CM

## 2021-12-03 DIAGNOSIS — Z3042 Encounter for surveillance of injectable contraceptive: Secondary | ICD-10-CM

## 2021-12-03 NOTE — Progress Notes (Signed)
11 weeks 3 days post hormonal injection, medroxyprogesterone acetate 150mg . Voices no concerns.  Medroxyprogesterone Acetate 150 mg. administered  today per order by , CNM dated 06/19/2021 for 1 year.  Tolerated well -  intramuscularly rt. Deltoid.  Next hormonal injection due 02/18/22 and reminder card provided.

## 2022-03-04 ENCOUNTER — Ambulatory Visit (LOCAL_COMMUNITY_HEALTH_CENTER): Payer: Medicaid Other

## 2022-03-04 VITALS — BP 112/77 | Ht 67.0 in | Wt 206.0 lb

## 2022-03-04 DIAGNOSIS — Z3042 Encounter for surveillance of injectable contraceptive: Secondary | ICD-10-CM

## 2022-03-04 DIAGNOSIS — Z309 Encounter for contraceptive management, unspecified: Secondary | ICD-10-CM | POA: Diagnosis not present

## 2022-03-04 NOTE — Progress Notes (Signed)
Client 13 week and 0 days post depo. Voices no concerns. Depo given today per order by Hazle Coca, CNM. Tolerated well in Left Deltoid. Next Depo Due 05/20/2022,  Next Physcial Exam due 06/15/2022.  Reminder card given.  Trudie Cervantes Sherrilyn Rist, RN

## 2022-05-22 ENCOUNTER — Ambulatory Visit (LOCAL_COMMUNITY_HEALTH_CENTER): Payer: Medicaid Other

## 2022-05-22 VITALS — BP 108/74 | Ht 67.0 in | Wt 207.0 lb

## 2022-05-22 DIAGNOSIS — Z3042 Encounter for surveillance of injectable contraceptive: Secondary | ICD-10-CM

## 2022-05-22 DIAGNOSIS — Z3202 Encounter for pregnancy test, result negative: Secondary | ICD-10-CM | POA: Diagnosis not present

## 2022-05-22 DIAGNOSIS — Z309 Encounter for contraceptive management, unspecified: Secondary | ICD-10-CM | POA: Diagnosis not present

## 2022-05-22 DIAGNOSIS — Z3009 Encounter for other general counseling and advice on contraception: Secondary | ICD-10-CM

## 2022-05-22 LAB — PREGNANCY, URINE: Preg Test, Ur: NEGATIVE

## 2022-05-22 MED ORDER — MEDROXYPROGESTERONE ACETATE 150 MG/ML IM SUSP
150.0000 mg | Freq: Once | INTRAMUSCULAR | Status: AC
  Administered 2022-05-22: 150 mg via INTRAMUSCULAR

## 2022-05-22 NOTE — Progress Notes (Signed)
11 weeks 2 days post depo. Patient reports spotting late January 2024 and for one week has had heavy bleeding with clots and cramping. Pt explains bleeding and cramping are better today. Pt states she normally has no periods while on depo. States she has been on depo for years. Denies itching, burning and has had no new sexual partners. Denies hx fibroids.   Consult E Sciora, CNM who orders UPT today and if negative, may administer depo today based on order by Ola Spurr, CNM dated 06/19/2021. .  Advises patient to keep log of bleeding and bring to annual physical appt, which is due 06/21/2022. If bleeding worsens, contact [facility] and may schedule provider appt.   Provider orders carried out. Patient in agreement. Negative UPT today.  Questions answered and reports understanding.  Depo administered today and tolerated well R delt. Annual PE due 06/21/2022 and next depo due 08/07/2022. Has reminders. Josie Saunders, RN

## 2022-05-22 NOTE — Progress Notes (Signed)
11 weeks 2 days post depo. Patient reports spotting late January 2024 and for one week has had heavy bleeding with clots and cramping. Pt explains bleeding and cramping are better today. Pt states she normally has no periods while on depo. States she has been on depo for years. Denies itching, burning and has had no new sexual partners. Denies hx fibroids.   Consult E Dong Nimmons, CNM who orders UPT today and if negative, may administer depo today based on order by Ola Spurr, CNM dated 06/19/2021. .  Advises patient to keep log of bleeding and bring to annual physical appt, which is due 06/21/2022. If bleeding worsens, contact [facility] and may schedule provider appt.   Provider orders carried out. Patient in agreement. Negative UPT today.  Questions answered and reports understanding.  Depo administered today and tolerated well R delt. Annual PE due 06/21/2022 and next depo due 08/07/2022. Has reminders.  Consulted on the plan of care for this client.  I agree with the documented note and actions taken to provide care for this client.  Ola Spurr, CNM  Herbie Saxon, CNM

## 2022-09-06 ENCOUNTER — Ambulatory Visit: Payer: Medicaid Other

## 2022-09-06 VITALS — BP 123/77 | Ht 67.0 in | Wt 205.5 lb

## 2022-09-06 DIAGNOSIS — Z309 Encounter for contraceptive management, unspecified: Secondary | ICD-10-CM | POA: Diagnosis not present

## 2022-09-06 DIAGNOSIS — Z3042 Encounter for surveillance of injectable contraceptive: Secondary | ICD-10-CM

## 2022-09-06 DIAGNOSIS — Z30013 Encounter for initial prescription of injectable contraceptive: Secondary | ICD-10-CM | POA: Diagnosis not present

## 2022-09-06 DIAGNOSIS — Z3009 Encounter for other general counseling and advice on contraception: Secondary | ICD-10-CM

## 2022-09-06 MED ORDER — MEDROXYPROGESTERONE ACETATE 150 MG/ML IM SUSP
150.0000 mg | Freq: Once | INTRAMUSCULAR | Status: AC
  Administered 2022-09-06: 150 mg via INTRAMUSCULAR

## 2022-09-06 MED ORDER — MEDROXYPROGESTERONE ACETATE 150 MG/ML IM SUSP: 150.0000 mg | Freq: Once | INTRAMUSCULAR | Status: DC

## 2022-09-06 NOTE — Progress Notes (Signed)
15 weeks 2 days post depo. Voices no concerns. RN advises patient that it is best to try to stay around 11-13 weeks post depo. Patient verbalizes understanding. Depo given today per S.O. (Dr. Kirtland Bouchard. [place]), as patient has not had yearly physical yet. Patient states she already has physical appointment scheduled for 10/03/2022. Tolerated well in L deltoid. Next depo due 11/22/2022, patient given reminder card for both physical and depo.  Abagail Kitchens, RN

## 2022-10-03 ENCOUNTER — Ambulatory Visit: Payer: Medicaid Other | Admitting: Family Medicine

## 2022-10-03 ENCOUNTER — Encounter: Payer: Self-pay | Admitting: Family Medicine

## 2022-10-03 VITALS — BP 102/70 | HR 64 | Ht 67.0 in | Wt 202.0 lb

## 2022-10-03 DIAGNOSIS — Z309 Encounter for contraceptive management, unspecified: Secondary | ICD-10-CM

## 2022-10-03 DIAGNOSIS — Z01419 Encounter for gynecological examination (general) (routine) without abnormal findings: Secondary | ICD-10-CM | POA: Diagnosis not present

## 2022-10-03 DIAGNOSIS — Z3009 Encounter for other general counseling and advice on contraception: Secondary | ICD-10-CM

## 2022-10-03 DIAGNOSIS — Z113 Encounter for screening for infections with a predominantly sexual mode of transmission: Secondary | ICD-10-CM

## 2022-10-03 LAB — WET PREP FOR TRICH, YEAST, CLUE
Trichomonas Exam: NEGATIVE
Yeast Exam: NEGATIVE

## 2022-10-03 LAB — HM HIV SCREENING LAB: HM HIV Screening: NEGATIVE

## 2022-10-03 NOTE — Progress Notes (Signed)
Pt here for family planning visit. Wet prep results reviewed with pt, no treatment required per standing orders.  Family planning reviewed and sent home with pt.  Condoms given. Gaspar Garbe, RN

## 2022-10-03 NOTE — Progress Notes (Signed)
[facility] DEPARTMENT [facility] [address] Main Number: [phone]  Family Planning Visit- Repeat Yearly Visit  Subjective:  Chelsea Adkins is a 37 y.o. G1P1001  being seen today for an annual wellness visit and to discuss contraception options.   The patient is currently using Hormonal Injection for pregnancy prevention. Patient does not want a pregnancy in the next year.    report they are looking for a method that provides High efficacy at preventing pregnancy   Patient has the following medical problems: has Obesity BMI=33.0 and Smoker on their problem list.  No chief complaint on file.   Patient reports to clinic for PE and renewal of depo prescription.   Patient denies concerns about self   See flowsheet for other program required questions.   Body mass index is 31.64 kg/m. - Patient is eligible for diabetes screening based on BMI> 25 and age >35?  yes HA1C ordered? yes  Patient reports 1 of partners in last year. Desires STI screening?  Yes   Has patient been screened once for HCV in the past?  No  No results found for: "HCVAB"  Does the patient have current of drug use, have a partner with drug use, and/or has been incarcerated since last result? No  If yes-- Screen for HCV through [facility]   Does the patient meet criteria for HBV testing? No  Criteria:  -Household, sexual or needle sharing contact with HBV -History of drug use -HIV positive -Those with known Hep C   Health Maintenance Due  Topic Date Due   Hepatitis C Screening  Never done   DTaP/Tdap/Td (2 - Tdap) 10/24/2012   COVID-19 Vaccine (3 - 2023-24 season) 11/23/2021    Review of Systems  Constitutional:  Negative for weight loss.  Eyes:  Negative for blurred vision.  Respiratory:  Negative for cough and shortness of breath.   Cardiovascular:  Negative for claudication.  Gastrointestinal:  Negative for nausea.  Genitourinary:  Negative for  dysuria and frequency.  Skin:  Negative for rash.  Neurological:  Negative for headaches.  Endo/Heme/Allergies:  Does not bruise/bleed easily.    The following portions of the patient's history were reviewed and updated as appropriate: allergies, current medications, past family history, past medical history, past social history, past surgical history and problem list. Problem list updated.  Objective:   Vitals:   10/03/22 1100  BP: 102/70  Pulse: 64  Weight: 202 lb (91.6 kg)  Height: 5\' 7"  (1.702 m)    Physical Exam Vitals and nursing note reviewed.  Constitutional:      Appearance: Normal appearance.  HENT:     Head: Normocephalic and atraumatic.     Mouth/Throat:     Mouth: Mucous membranes are moist.     Pharynx: Oropharynx is clear. No oropharyngeal exudate or posterior oropharyngeal erythema.  Pulmonary:     Effort: Pulmonary effort is normal.  Abdominal:     General: Abdomen is flat.     Palpations: There is no mass.     Tenderness: There is no abdominal tenderness. There is no rebound.  Genitourinary:    General: Normal vulva.     Exam position: Lithotomy position.     Pubic Area: No rash or pubic lice.      Labia:        Right: No rash or lesion.        Left: No rash or lesion.      Vagina: Normal.  No vaginal discharge, erythema, bleeding or lesions.     Cervix: No cervical motion tenderness, discharge, friability, lesion or erythema.     Uterus: Normal.      Adnexa: Right adnexa normal and left adnexa normal.     Rectum: Normal.     Comments: pH = 4-5 Lymphadenopathy:     Head:     Right side of head: No preauricular or posterior auricular adenopathy.     Left side of head: No preauricular or posterior auricular adenopathy.     Cervical: No cervical adenopathy.     Upper Body:     Right upper body: No supraclavicular, axillary or epitrochlear adenopathy.     Left upper body: No supraclavicular, axillary or epitrochlear adenopathy.     Lower Body: No  right inguinal adenopathy. No left inguinal adenopathy.  Skin:    General: Skin is warm and dry.     Findings: No rash.  Neurological:     Mental Status: She is alert and oriented to person, place, and time.       Assessment and Plan:  Chelsea Adkins is a 37 y.o. female G1P1001 presenting to the [facility] Department for an yearly wellness and contraception visit  1. Well woman exam with routine gynecological exam -denies concerns about self -last pap NILM, and due in 2027 -last CBE 05/2021 (normal), next due in 05/2024  - Hgb A1c w/o eAG  2. Screening for venereal disease  - Chlamydia/Gonorrhea [place] [facility] - HIV [place] [facility] - Syphilis Serology, [place] [facility] - WET PREP FOR TRICH, YEAST, CLUE  3. Family planning Contraception counseling: Reviewed options based on patient desire and reproductive life plan. Patient is interested in Hormonal Injection. This was not provided to the patient today. Shot received on 09/06/22 and not due today  Risks, benefits, and typical effectiveness rates were reviewed.  Questions were answered.  Written information was also given to the patient to review.    The patient will follow up in  3 months for surveillance.  The patient was told to call with any further questions, or with any concerns about this method of contraception.  Emphasized use of condoms 100% of the time for STI prevention.  Educated on ECP and assessed need for ECP. Not indicated- covered under depo window    Return in about 3 months (around 01/03/2023) for depo injection.  No future appointments.  Lenice Llamas, [place]

## 2022-10-04 LAB — HGB A1C W/O EAG: Hgb A1c MFr Bld: 5.6 % (ref 4.8–5.6)

## 2022-11-26 ENCOUNTER — Ambulatory Visit: Payer: Medicaid Other

## 2022-11-26 ENCOUNTER — Ambulatory Visit (LOCAL_COMMUNITY_HEALTH_CENTER): Payer: Medicaid Other

## 2022-11-26 VITALS — BP 112/72 | Ht 67.0 in | Wt 207.0 lb

## 2022-11-26 DIAGNOSIS — Z309 Encounter for contraceptive management, unspecified: Secondary | ICD-10-CM | POA: Diagnosis not present

## 2022-11-26 DIAGNOSIS — Z3042 Encounter for surveillance of injectable contraceptive: Secondary | ICD-10-CM

## 2022-11-26 DIAGNOSIS — Z3009 Encounter for other general counseling and advice on contraception: Secondary | ICD-10-CM

## 2022-11-26 DIAGNOSIS — Z30013 Encounter for initial prescription of injectable contraceptive: Secondary | ICD-10-CM | POA: Diagnosis not present

## 2022-11-26 MED ORDER — MEDROXYPROGESTERONE ACETATE 150 MG/ML IM SUSP
150.0000 mg | INTRAMUSCULAR | Status: DC
Start: 2022-11-26 — End: 2023-10-24
  Administered 2022-11-26 – 2023-08-01 (×4): 150 mg via INTRAMUSCULAR

## 2022-11-26 NOTE — Progress Notes (Signed)
11 weeks 4 days post depo. Patient reports menses starting 11/20/2022 with flow ranging from light to heavy to light with cramping. States she normally does not have periods.  Consult Aliene Altes, FNP who explains irregular bleeding can occur with depo.Ok to give depo today.  Depo given today per order by Aliene Altes, FNP. Tolerated well RUOQ. Advised to contact [facility] if bleeding/cramping do not resolve. Questions answered and reports understanding. Jerel Shepherd, RN

## 2023-02-13 ENCOUNTER — Ambulatory Visit: Payer: Medicaid Other

## 2023-02-13 VITALS — BP 111/70 | Ht 67.0 in | Wt 208.5 lb

## 2023-02-13 DIAGNOSIS — Z309 Encounter for contraceptive management, unspecified: Secondary | ICD-10-CM | POA: Diagnosis not present

## 2023-02-13 DIAGNOSIS — Z30013 Encounter for initial prescription of injectable contraceptive: Secondary | ICD-10-CM | POA: Diagnosis not present

## 2023-02-13 DIAGNOSIS — Z3042 Encounter for surveillance of injectable contraceptive: Secondary | ICD-10-CM

## 2023-02-13 DIAGNOSIS — Z3009 Encounter for other general counseling and advice on contraception: Secondary | ICD-10-CM

## 2023-02-13 NOTE — Progress Notes (Signed)
11 weeks 2 days post depo. Voices no concerns. Irregular bleeding has resolved and patient has not had a period. RN explained this is normal.  Depo given today per order by Aliene Altes, FNP dated 11/26/2022. Tolerated well LUOQ. Next depo due 05/01/2023, has reminder. Jerel Shepherd, RN

## 2023-04-09 ENCOUNTER — Ambulatory Visit: Payer: Medicaid Other | Admitting: Family Medicine

## 2023-04-09 DIAGNOSIS — Z113 Encounter for screening for infections with a predominantly sexual mode of transmission: Secondary | ICD-10-CM

## 2023-04-09 LAB — WET PREP FOR TRICH, YEAST, CLUE
Trichomonas Exam: NEGATIVE
Yeast Exam: NEGATIVE

## 2023-04-09 LAB — HM HIV SCREENING LAB: HM HIV Screening: NEGATIVE

## 2023-04-09 MED ORDER — METRONIDAZOLE 500 MG PO TABS
500.0000 mg | ORAL_TABLET | Freq: Two times a day (BID) | ORAL | Status: AC
Start: 2023-04-09 — End: 2023-04-16

## 2023-04-09 NOTE — Addendum Note (Signed)
 Addended by: Austine Lefort on: 04/09/2023 10:52 AM   Modules accepted: Orders

## 2023-04-09 NOTE — Progress Notes (Signed)
 Pt is here for STD screening , Wet prep results reviewed with pt.The patient was dispensed metronidazole 500 mg 2x/day for 7 days. I provided counseling today regarding the medication. We discussed the medication, the side effects and when to call clinic. Patient given the opportunity to ask questions for any clarifications. Condoms Given.Sonda Primes, RN.

## 2023-04-09 NOTE — Progress Notes (Signed)
 [facility] Department STI clinic [address], [address] [place] [address] Main phone: [phone]  STI screening visit  Subjective:  Chelsea Adkins is a 38 y.o. female being seen today for an STI screening visit. The patient reports they do not have symptoms.  Patient reports that they do not desire a pregnancy in the next year.   They reported they are not interested in discussing contraception today.    No LMP recorded (lmp unknown). Patient has had an injection.  Patient has the following medical conditions:   Patient Active Problem List   Diagnosis Date Noted   Smoker 06/19/2021   Obesity BMI=33.0 06/16/2020    Chief Complaint  Patient presents with   SEXUALLY TRANSMITTED DISEASE    Pt is here for STD screening and has no symptoms    HPI HPI Patient reports to clinic for screening for STIs  Does the patient using douching products? No  Last HIV test per patient/review of record was  Lab Results  Component Value Date   HMHIVSCREEN Negative - Validated 10/03/2022   No results found for: "HIV"   Last HEPC test per patient/review of record was No results found for: "HMHEPCSCREEN" No components found for: "HEPC"   Last HEPB test per patient/review of record was No components found for: "HMHEPBSCREEN" No components found for: "HEPC"   Patient reports last pap was:   No results found for: "DIAGPAP", "HPVHIGH", "ADEQPAP" Lab Results  Component Value Date   SPECADGYN Comment 06/16/2020   Result Date Procedure Results Follow-ups  06/16/2020 IGP, Aptima HPV DIAGNOSIS:: Comment Specimen adequacy:: Comment Clinician Provided ICD10: Comment Performed by:: Comment PAP Smear Comment: . Note:: Comment Test Methodology: Comment HPV Aptima: Negative     Screening for MPX risk: Does the patient have an unexplained rash? No Is the patient MSM? No Does the patient endorse multiple sex partners or anonymous sex partners? No Did the patient  have close or sexual contact with a person diagnosed with MPX? No Has the patient traveled outside the [place]  where MPX is endemic? No Is there a high clinical suspicion for MPX-- evidenced by one of the following No  -Unlikely to be chickenpox  -Lymphadenopathy  -Rash that present in same phase of evolution on any given body part See flowsheet for further details and programmatic requirements.   Immunization history:  Immunization History  Administered Date(s) Administered   Hepatitis B 01/10/1997, 02/07/1997, 07/18/1997   PFIZER(Purple Top)SARS-COV-2 Vaccination 06/20/2019, 07/11/2019   Td 10/25/2002     The following portions of the patient's history were reviewed and updated as appropriate: allergies, current medications, past medical history, past social history, past surgical history and problem list.  Objective:  There were no vitals filed for this visit.  Physical Exam Vitals and nursing note reviewed. Exam conducted with a chaperone present Marylou Sobers CMA).  Constitutional:      Appearance: Normal appearance.  HENT:     Head: Normocephalic and atraumatic.     Mouth/Throat:     Mouth: Mucous membranes are moist.     Pharynx: Oropharynx is clear. No oropharyngeal exudate or posterior oropharyngeal erythema.  Pulmonary:     Effort: Pulmonary effort is normal.  Abdominal:     General: Abdomen is flat.     Palpations: There is no mass.     Tenderness: There is no abdominal tenderness. There is no rebound.  Genitourinary:    General: Normal vulva.     Exam position: Lithotomy position.  Pubic Area: No rash or pubic lice.      Labia:        Right: No rash or lesion.        Left: No rash or lesion.      Vagina: Vaginal discharge present. No erythema, bleeding or lesions.     Cervix: No cervical motion tenderness, discharge, friability, lesion or erythema.     Uterus: Normal.      Adnexa: Right adnexa normal and left adnexa normal.     Rectum: Normal.     Comments: pH  = 4  Mild amt of white discharge present Lymphadenopathy:     Head:     Right side of head: No preauricular or posterior auricular adenopathy.     Left side of head: No preauricular or posterior auricular adenopathy.     Cervical: No cervical adenopathy.     Upper Body:     Right upper body: No supraclavicular, axillary or epitrochlear adenopathy.     Left upper body: No supraclavicular, axillary or epitrochlear adenopathy.     Lower Body: No right inguinal adenopathy. No left inguinal adenopathy.  Skin:    General: Skin is warm and dry.     Findings: No rash.  Neurological:     Mental Status: She is alert and oriented to person, place, and time.     Assessment and Plan:  Chelsea Adkins is a 38 y.o. female presenting to the [facility] Department for STI screening  1. Screening for venereal disease (Primary)  - HIV [place] [facility] - Chlamydia/Gonorrhea [place] [facility] - Syphilis Serology, [place] [facility] - WET PREP FOR TRICH, YEAST, CLUE   Patient accepted all screenings including vaginal CT/GC and bloodwork for HIV/RPR, and wet prep. Patient meets criteria for HepB screening? No. Ordered? not applicable Patient meets criteria for HepC screening? No. Ordered? not applicable  Treat wet prep per standing order Discussed time line for [facility] results and that patient will be called with positive results and encouraged patient to call if she had not heard in 2 weeks.  Counseled to return or seek care for continued or worsening symptoms Recommended repeat testing in 3 months with positive results. Recommended condom use with all sex for STI prevention.   Patient is currently using Hormonal Contraception: Injection, Rings and Patches to prevent pregnancy.    Return if symptoms worsen or fail to improve, for STI screening.  No future appointments.  Earleen Glazier, [place]

## 2023-04-25 NOTE — Addendum Note (Signed)
Addended by: Berdie Ogren on: 04/25/2023 07:59 AM   Modules accepted: Orders

## 2023-05-09 ENCOUNTER — Ambulatory Visit: Payer: Medicaid Other

## 2023-05-09 VITALS — BP 111/67 | Ht 67.0 in | Wt 206.0 lb

## 2023-05-09 DIAGNOSIS — Z3042 Encounter for surveillance of injectable contraceptive: Secondary | ICD-10-CM

## 2023-05-09 DIAGNOSIS — Z309 Encounter for contraceptive management, unspecified: Secondary | ICD-10-CM

## 2023-05-09 DIAGNOSIS — Z30013 Encounter for initial prescription of injectable contraceptive: Secondary | ICD-10-CM | POA: Diagnosis not present

## 2023-05-09 DIAGNOSIS — Z3009 Encounter for other general counseling and advice on contraception: Secondary | ICD-10-CM

## 2023-05-09 NOTE — Progress Notes (Signed)
12w 1d post depo. Voices no concerns. Depo given today per order by Aliene Altes, FNP dated 11/26/2022. Tolerated well in RUOQ. Next depo due 07/25/2023; patient has reminder.   Chelsea Kitchens, RN

## 2023-08-01 ENCOUNTER — Ambulatory Visit

## 2023-08-01 VITALS — BP 108/66 | Ht 67.0 in | Wt 207.0 lb

## 2023-08-01 DIAGNOSIS — Z3009 Encounter for other general counseling and advice on contraception: Secondary | ICD-10-CM

## 2023-08-01 DIAGNOSIS — Z309 Encounter for contraceptive management, unspecified: Secondary | ICD-10-CM

## 2023-08-01 DIAGNOSIS — Z3042 Encounter for surveillance of injectable contraceptive: Secondary | ICD-10-CM

## 2023-08-01 DIAGNOSIS — Z30013 Encounter for initial prescription of injectable contraceptive: Secondary | ICD-10-CM | POA: Diagnosis not present

## 2023-08-01 NOTE — Progress Notes (Signed)
 12 Weeks   0 Days since last Depo   Voices no concerns today.  Counseled to adhere to 11 to 13 week intervals between depo injections for optimal benefit.  Depo given today per order by Fain Home, FNP  dated 11/26/2022.  Tolerated well LUOQ.  Next depo due 10/17/2023 has reminder card.  Kamiah Fite, RN

## 2023-10-24 ENCOUNTER — Ambulatory Visit: Admitting: Family Medicine

## 2023-10-24 ENCOUNTER — Encounter: Payer: Self-pay | Admitting: Family Medicine

## 2023-10-24 VITALS — BP 118/75 | HR 79 | Ht 67.0 in | Wt 205.8 lb

## 2023-10-24 DIAGNOSIS — Z309 Encounter for contraceptive management, unspecified: Secondary | ICD-10-CM | POA: Diagnosis not present

## 2023-10-24 DIAGNOSIS — Z308 Encounter for other contraceptive management: Secondary | ICD-10-CM | POA: Diagnosis not present

## 2023-10-24 DIAGNOSIS — Z3042 Encounter for surveillance of injectable contraceptive: Secondary | ICD-10-CM

## 2023-10-24 DIAGNOSIS — Z113 Encounter for screening for infections with a predominantly sexual mode of transmission: Secondary | ICD-10-CM

## 2023-10-24 DIAGNOSIS — Z30013 Encounter for initial prescription of injectable contraceptive: Secondary | ICD-10-CM | POA: Diagnosis not present

## 2023-10-24 LAB — WET PREP FOR TRICH, YEAST, CLUE
Clue Cell Exam: POSITIVE — AB
Trichomonas Exam: NEGATIVE
Yeast Exam: NEGATIVE

## 2023-10-24 LAB — HM HIV SCREENING LAB: HM HIV Screening: NEGATIVE

## 2023-10-24 MED ORDER — MEDROXYPROGESTERONE ACETATE 150 MG/ML IM SUSY
150.0000 mg | PREFILLED_SYRINGE | Freq: Once | INTRAMUSCULAR | Status: AC
  Administered 2023-10-24: 150 mg via INTRAMUSCULAR

## 2023-10-24 NOTE — Progress Notes (Signed)
 Pt is here for family planning visit.  Family planning education card reviewed and given to pt.  Wet prep results reviewed, no treatment required per standing orders. Condoms declined. Depo given in RUOQ and tolerated well, reminder card given.  Caren Channel, RN

## 2023-10-24 NOTE — Progress Notes (Signed)
 [facility] DEPART[facility] [address], [address] [place] [address] Main phone: [phone]  Family Planning Visit - Repeat Yearly Visit  Subjective:  Chelsea Adkins is a 38 y.o. G1P1001  being seen today for an annual wellness visit and to discuss contraception options. The patient is currently using hormonal injection for pregnancy prevention. Patient does not want a pregnancy in the next year.   Patient reports they are looking for a method with the following characteristics:  High efficacy at preventing pregnancy Discrete method Long term method  Patient has the following medical problems:  Patient Active Problem List   Diagnosis Date Noted   Encounter for surveillance of injectable contraceptive 10/28/2023   Smoker 06/19/2021   Obesity 06/16/2020   Chief Complaint  Patient presents with   Annual Exam   HPI Patient reports she would like an annual physical, depo provera , and STI testing. She reports being on Depo Provera  continuously for about the past 10 years without issues.   Given time constraints (MD running behind schedule and patient needing to leave to pick up children), an abbreviated exam and counseling was performed.  Review of Systems  Constitutional:  Negative for fever, malaise/fatigue and weight loss.  Respiratory:  Negative for shortness of breath.   Cardiovascular:  Negative for chest pain and palpitations.   See flowsheet for further details and programmatic requirements Hyperlink available at the top of the signed note in blue.  Flow sheet content below:  Pregnancy Intention Screening Does the patient want to become pregnant in the next year?: No Does the patient's partner want to become pregnant in the next year?: No Would the patient like to discuss contraceptive options today?: Yes Other:  Password: [email or web address] Is it okay to contact you by mail?: Yes Difficulty accessing hygiene products (feminine  products/soap) in the last 3 months: No Contraception History Past methods of contraception used by patient:: Contraceptive Pill, Hormonal Injection Adverse effects associated with Contraceptive Pill: nausea and forgot to take Adverse effects associated with Hormonal Injection: none Sexual History What age did you start your period?: 11 How often do you have your period?: none Date of last sex?: 10/19/23 Has the patient had unprotected sex within the last 5 days?: No Do you have sex with men, women, both men and women?: Men only In the past 2 months how many partners have you had sex with?: 1 In the past 12 months, how many partners have you had sex with?: 1 Is it possible that any of your sex partners in the past 12 months had sex with someone else whild they were still in a sexual relationship with you?: No What ways do you have sex?: Vaginal, Oral Do you or your partner use condoms and/or dental dams every time you have vaginal, oral or anal sex?: Sometimes Do you douche?: No Date of last HIV test?: 04/09/23 Have you ever had an STD?: Yes Have you or your partner ever shot up drugs?: No Have any of your partners used drugs in the past?: No Have you or your partners exchanged money or drugs for sex?: No Counseling Education: Make informed decision about family planning, Provided preconception counseling, Reduce risk of transmission and protection from STD's and HIV, Understand BMI >25 or >18.5 is a health risk (weight management educational materials to be provided to client requests), Review immunization history, inform client of recommended vaccines per CDC's ACIP Guidelines and refer to Immunization clinic, Promoted daily consumption of MVI  with folic acid if capable of conceiving., How to discontinue the method selected and information on back up method used, Results of physical assessment and labs (if performed), How to use the method selected and information on back up method used, How  to use the method consistently and correctly, Teach back method completed, When to return for follow up (planned return schedule), Warning signs for rare but serious adverse events and what to do if they experience a warning sign (including emergency 24 hour number, where to seek emergency service outside of hours of operation), PCP list given to patient Contraception Wrap Up Current Method: Hormonal Injection End Method: Hormonal Injection Contraception Counseling Provided: Yes How was the end contraceptive method provided?: Provided on site  Diabetes screening This patient is 38 y.o. with a BMI of Body mass index is 32.23 kg/m.SABRA  Is patient eligible for diabetes screening (age >35 and BMI >25)?  yes  Was Hgb A1c ordered? no  STI screening Patient reports 1 of partners in last year.  Does this patient desire STI screening?  Yes  Hepatitis C screening Has patient been screened once for HCV in the past?  No  No results found for: HCVAB  Cervical Cancer Screening  Result Date Procedure Results Follow-ups  06/16/2020 IGP, Aptima HPV DIAGNOSIS:: Comment Specimen adequacy:: Comment Clinician Provided ICD10: Comment Performed by:: Comment PAP Smear Comment: . Note:: Comment Test Methodology: Comment HPV Aptima: Negative    Health Maintenance Due  Topic Date Due   Hepatitis C Screening  Never done   Pneumococcal Vaccine: 19-49 Years (1 of 2 - PCV) Never done   HPV VACCINES (1 - 3-dose SCDM series) Never done   DTaP/Tdap/Td (2 - Tdap) 10/24/2012   COVID-19 Vaccine (3 - 2024-25 season) 11/24/2022   INFLUENZA VACCINE  10/24/2023   The following portions of the patient's history were reviewed and updated as appropriate: allergies, current medications, past family history, past medical history, past social history, past surgical history and problem list. Problem list updated.  Objective:   Vitals:   10/24/23 1543  BP: 118/75  Pulse: 79  Weight: 205 lb 12.8 oz (93.4 kg)  Height:  5' 7 (1.702 m)   Physical Exam Vitals and nursing note reviewed.  Constitutional:      Appearance: Normal appearance.  HENT:     Head: Normocephalic.     Mouth/Throat:     Mouth: Mucous membranes are moist.  Eyes:     General: No scleral icterus.       Right eye: No discharge.        Left eye: No discharge.     Conjunctiva/sclera: Conjunctivae normal.  Cardiovascular:     Rate and Rhythm: Normal rate and regular rhythm.     Heart sounds: Normal heart sounds.  Pulmonary:     Effort: Pulmonary effort is normal.     Breath sounds: Normal breath sounds.  Abdominal:     Palpations: Abdomen is soft.  Genitourinary:    Comments: Declined genital exam-self swabbed Musculoskeletal:        General: Normal range of motion.  Skin:    Findings: No lesion or rash.  Neurological:     General: No focal deficit present.     Mental Status: She is alert and oriented to person, place, and time.  Psychiatric:        Mood and Affect: Mood normal.        Behavior: Behavior normal.    Assessment and Plan:  Chelsea Adkins is a 38 y.o. female G1P1001 presenting to the [facility] Department for an yearly wellness and contraception visit  Contraception counseling:  Reviewed options based on patient desire and reproductive life plan. Patient is interested in Hormonal Injection. This was provided to the patient today.   Risks, benefits, and typical effectiveness rates were reviewed.  Questions were answered.  Written information was also given to the patient to review.    The patient will follow up in  3 months for surveillance.  The patient was told to call with any further questions, or with any concerns about this method of contraception.  Emphasized use of condoms 100% of the time for STI prevention.  Emergency Contraception Precautions (ECP): Patient assessed for need of ECP. She is not a candidate based on depo provera  within recommended dates.   Screening examination for  venereal disease -     WET PREP FOR TRICH, YEAST, CLUE -     Chlamydia/Gonorrhea [place] [facility] -     HIV [place] [facility] -     Syphilis Serology, [place] [facility]  Encounter for surveillance of injectable contraceptive Assessment & Plan: Patient received injection today without side effect, tolerated well. Patient reports being on depo provera  continuously for about 10 years or so. Discussed risk factors with depo provera  use past 3-5 years continuously. Encouraged her to look into alternative contraceptive methods and return in 3 months ready to switch to a different method.   Orders: -     medroxyPROGESTERone  Acetate   Return in about 3 months (around 01/24/2024).  No future appointments.  Betsey CHRISTELLA Helling, MD

## 2023-10-28 ENCOUNTER — Encounter: Payer: Self-pay | Admitting: Family Medicine

## 2023-10-28 DIAGNOSIS — Z3042 Encounter for surveillance of injectable contraceptive: Secondary | ICD-10-CM | POA: Insufficient documentation

## 2023-10-28 NOTE — Assessment & Plan Note (Signed)
 Patient received injection today without side effect, tolerated well. Patient reports being on depo provera  continuously for about 10 years or so. Discussed risk factors with depo provera  use past 3-5 years continuously. Encouraged her to look into alternative contraceptive methods and return in 3 months ready to switch to a different method.

## 2023-10-31 ENCOUNTER — Encounter: Payer: Self-pay | Admitting: Family Medicine

## 2024-01-29 ENCOUNTER — Ambulatory Visit

## 2024-01-29 VITALS — BP 122/85 | Ht 68.0 in | Wt 204.5 lb

## 2024-01-29 DIAGNOSIS — Z3009 Encounter for other general counseling and advice on contraception: Secondary | ICD-10-CM

## 2024-01-29 DIAGNOSIS — Z3042 Encounter for surveillance of injectable contraceptive: Secondary | ICD-10-CM

## 2024-01-29 DIAGNOSIS — Z308 Encounter for other contraceptive management: Secondary | ICD-10-CM | POA: Diagnosis not present

## 2024-01-29 DIAGNOSIS — Z30013 Encounter for initial prescription of injectable contraceptive: Secondary | ICD-10-CM | POA: Diagnosis not present

## 2024-01-29 MED ORDER — MEDROXYPROGESTERONE ACETATE 150 MG/ML IM SUSP
150.0000 mg | Freq: Once | INTRAMUSCULAR | Status: AC
  Administered 2024-01-29: 150 mg via INTRAMUSCULAR

## 2024-01-29 NOTE — Progress Notes (Signed)
 Pt presents for Depo injection.   13 weeks 6 days past due  Patient concerns discussed this visit possibly being her last visit getting Depo.  Patient had discussion with provider about making the change to another [facility].   Patient was encouraged to make follow-up appointment to discuss pro/cons of Implanon and IUD.  Depo given today per order by Dr. Dorothyann Helling on 10/24/2023. Consent 09/14/2021  Next due 04/15/2024  Kandi KATHEE Glatter, RN
